# Patient Record
Sex: Female | Born: 1965 | Race: White | Hispanic: No | Smoking: Never smoker
Health system: Southern US, Community
[De-identification: ages and names within clinical notes are randomized; demographics above are authoritative.]

## PROBLEM LIST (undated history)

## (undated) DIAGNOSIS — E785 Hyperlipidemia, unspecified: Secondary | ICD-10-CM

## (undated) DIAGNOSIS — N809 Endometriosis, unspecified: Secondary | ICD-10-CM

## (undated) DIAGNOSIS — E049 Nontoxic goiter, unspecified: Secondary | ICD-10-CM

## (undated) DIAGNOSIS — I10 Essential (primary) hypertension: Secondary | ICD-10-CM

## (undated) HISTORY — DX: Hyperlipidemia, unspecified: E78.5

## (undated) HISTORY — DX: Nontoxic goiter, unspecified: E04.9

## (undated) HISTORY — PX: OOPHORECTOMY: SHX86

## (undated) HISTORY — DX: Endometriosis, unspecified: N80.9

---

## 2002-04-10 ENCOUNTER — Other Ambulatory Visit: Admission: RE | Admit: 2002-04-10 | Discharge: 2002-04-10 | Payer: Self-pay | Admitting: Obstetrics & Gynecology

## 2006-10-15 HISTORY — PX: ABDOMINAL HYSTERECTOMY: SHX81

## 2015-09-05 ENCOUNTER — Other Ambulatory Visit: Payer: Self-pay

## 2015-09-05 DIAGNOSIS — Z1231 Encounter for screening mammogram for malignant neoplasm of breast: Secondary | ICD-10-CM

## 2015-10-12 ENCOUNTER — Ambulatory Visit: Payer: Self-pay

## 2015-10-16 DIAGNOSIS — I10 Essential (primary) hypertension: Secondary | ICD-10-CM

## 2015-10-16 HISTORY — DX: Essential (primary) hypertension: I10

## 2016-04-05 DIAGNOSIS — R002 Palpitations: Secondary | ICD-10-CM | POA: Insufficient documentation

## 2016-08-20 DIAGNOSIS — F419 Anxiety disorder, unspecified: Secondary | ICD-10-CM | POA: Insufficient documentation

## 2017-01-25 ENCOUNTER — Emergency Department: Payer: BLUE CROSS/BLUE SHIELD

## 2017-01-25 ENCOUNTER — Emergency Department
Admission: EM | Admit: 2017-01-25 | Discharge: 2017-01-25 | Disposition: A | Discharge status: Home / Self Care | Payer: BLUE CROSS/BLUE SHIELD | Attending: Emergency Medicine | Admitting: Emergency Medicine

## 2017-01-25 ENCOUNTER — Encounter: Payer: Self-pay | Admitting: Emergency Medicine

## 2017-01-25 DIAGNOSIS — N838 Other noninflammatory disorders of ovary, fallopian tube and broad ligament: Secondary | ICD-10-CM

## 2017-01-25 DIAGNOSIS — Z79899 Other long term (current) drug therapy: Secondary | ICD-10-CM | POA: Insufficient documentation

## 2017-01-25 DIAGNOSIS — I10 Essential (primary) hypertension: Secondary | ICD-10-CM | POA: Insufficient documentation

## 2017-01-25 DIAGNOSIS — N839 Noninflammatory disorder of ovary, fallopian tube and broad ligament, unspecified: Secondary | ICD-10-CM | POA: Insufficient documentation

## 2017-01-25 DIAGNOSIS — R1031 Right lower quadrant pain: Secondary | ICD-10-CM | POA: Diagnosis present

## 2017-01-25 HISTORY — DX: Essential (primary) hypertension: I10

## 2017-01-25 LAB — CBC
HCT: 40 % (ref 35.0–47.0)
HEMOGLOBIN: 13.7 g/dL (ref 12.0–16.0)
MCH: 31.2 pg (ref 26.0–34.0)
MCHC: 34.2 g/dL (ref 32.0–36.0)
MCV: 91.1 fL (ref 80.0–100.0)
PLATELETS: 218 10*3/uL (ref 150–440)
RBC: 4.39 MIL/uL (ref 3.80–5.20)
RDW: 13 % (ref 11.5–14.5)
WBC: 4.4 10*3/uL (ref 3.6–11.0)

## 2017-01-25 LAB — URINALYSIS, COMPLETE (UACMP) WITH MICROSCOPIC
BILIRUBIN URINE: NEGATIVE
GLUCOSE, UA: NEGATIVE mg/dL
HGB URINE DIPSTICK: NEGATIVE
KETONES UR: NEGATIVE mg/dL
LEUKOCYTES UA: NEGATIVE
NITRITE: NEGATIVE
PH: 6 (ref 5.0–8.0)
Protein, ur: NEGATIVE mg/dL
Specific Gravity, Urine: 1.016 (ref 1.005–1.030)

## 2017-01-25 LAB — COMPREHENSIVE METABOLIC PANEL
ALBUMIN: 4.3 g/dL (ref 3.5–5.0)
ALK PHOS: 54 U/L (ref 38–126)
ALT: 19 U/L (ref 14–54)
AST: 21 U/L (ref 15–41)
Anion gap: 5 (ref 5–15)
BUN: 11 mg/dL (ref 6–20)
CALCIUM: 8.9 mg/dL (ref 8.9–10.3)
CO2: 26 mmol/L (ref 22–32)
CREATININE: 0.69 mg/dL (ref 0.44–1.00)
Chloride: 105 mmol/L (ref 101–111)
GFR calc Af Amer: >60 mL/min (ref >60)
GFR calc non Af Amer: >60 mL/min (ref >60)
GLUCOSE: 98 mg/dL (ref 65–99)
Potassium: 3.9 mmol/L (ref 3.5–5.1)
SODIUM: 136 mmol/L (ref 135–145)
Total Bilirubin: 0.7 mg/dL (ref 0.3–1.2)
Total Protein: 7.6 g/dL (ref 6.5–8.1)

## 2017-01-25 LAB — POCT PREGNANCY, URINE: Preg Test, Ur: NEGATIVE

## 2017-01-25 LAB — LIPASE, BLOOD: Lipase: 19 U/L (ref 11–51)

## 2017-01-25 MED ORDER — ONDANSETRON HCL 4 MG/2ML IJ SOLN
INTRAMUSCULAR | Status: AC
  Administered 2017-01-25: 4 mg via INTRAVENOUS
  Filled 2017-01-25: qty 2

## 2017-01-25 MED ORDER — FENTANYL CITRATE (PF) 100 MCG/2ML IJ SOLN
50.0000 ug | INTRAMUSCULAR | Status: DC | PRN
  Administered 2017-01-25: 50 ug via INTRAVENOUS

## 2017-01-25 MED ORDER — ONDANSETRON HCL 4 MG PO TABS: 4.0000 mg | ORAL_TABLET | Freq: Every day | ORAL | 1 refills | Status: DC | PRN

## 2017-01-25 MED ORDER — IOPAMIDOL (ISOVUE-300) INJECTION 61%
30.0000 mL | Freq: Once | INTRAVENOUS | Status: AC | PRN
  Administered 2017-01-25: 30 mL via ORAL

## 2017-01-25 MED ORDER — ONDANSETRON HCL 4 MG/2ML IJ SOLN
4.0000 mg | Freq: Once | INTRAMUSCULAR | Status: AC
  Administered 2017-01-25: 4 mg via INTRAVENOUS
  Filled 2017-01-25: qty 2

## 2017-01-25 MED ORDER — OXYCODONE-ACETAMINOPHEN 7.5-325 MG PO TABS: 1.0000 | ORAL_TABLET | ORAL | 0 refills | Status: DC | PRN

## 2017-01-25 MED ORDER — MORPHINE SULFATE (PF) 4 MG/ML IV SOLN
4.0000 mg | Freq: Once | INTRAVENOUS | Status: AC
  Administered 2017-01-25: 4 mg via INTRAVENOUS
  Filled 2017-01-25: qty 1

## 2017-01-25 MED ORDER — KETOROLAC TROMETHAMINE 30 MG/ML IJ SOLN
15.0000 mg | Freq: Once | INTRAMUSCULAR | Status: AC
  Administered 2017-01-25: 15 mg via INTRAVENOUS
  Filled 2017-01-25: qty 1

## 2017-01-25 MED ORDER — IOPAMIDOL (ISOVUE-300) INJECTION 61%
100.0000 mL | Freq: Once | INTRAVENOUS | Status: AC | PRN
  Administered 2017-01-25: 100 mL via INTRAVENOUS

## 2017-01-25 MED ORDER — ONDANSETRON HCL 4 MG/2ML IJ SOLN
4.0000 mg | Freq: Once | INTRAMUSCULAR | Status: AC
  Administered 2017-01-25: 4 mg via INTRAVENOUS

## 2017-01-25 MED ORDER — FENTANYL CITRATE (PF) 100 MCG/2ML IJ SOLN
INTRAMUSCULAR | Status: AC
  Filled 2017-01-25: qty 2

## 2017-01-25 NOTE — Discharge Instructions (Signed)
Please make an appointment to establish care with an OB gynecologist within 1 week for reexamination. Return to the emergency department immediately for any new or worsening symptoms such as worsening pain, if you cannot eat or drink, or for any other concerns whatsoever.  It was a pleasure to take care of you today, and thank you for coming to our emergency department.  If you have any questions or concerns before leaving please ask the nurse to grab me and I'm more than happy to go through your aftercare instructions again.  If you were prescribed any opioid pain medication today such as Norco, Vicodin, Percocet, morphine, hydrocodone, or oxycodone please make sure you do not drive when you are taking this medication as it can alter your ability to drive safely.  If you have any concerns once you are home that you are not improving or are in fact getting worse before you can make it to your follow-up appointment, please do not hesitate to call 911 and come back for further evaluation.  Tanya Brittle MD  Results for orders placed or performed during the hospital encounter of 01/25/17  Lipase, blood  Result Value Ref Range   Lipase 19 11 - 51 U/L  Comprehensive metabolic panel  Result Value Ref Range   Sodium 136 135 - 145 mmol/L   Potassium 3.9 3.5 - 5.1 mmol/L   Chloride 105 101 - 111 mmol/L   CO2 26 22 - 32 mmol/L   Glucose, Bld 98 65 - 99 mg/dL   BUN 11 6 - 20 mg/dL   Creatinine, Ser 6.57 0.44 - 1.00 mg/dL   Calcium 8.9 8.9 - 84.6 mg/dL   Total Protein 7.6 6.5 - 8.1 g/dL   Albumin 4.3 3.5 - 5.0 g/dL   AST 21 15 - 41 U/L   ALT 19 14 - 54 U/L   Alkaline Phosphatase 54 38 - 126 U/L   Total Bilirubin 0.7 0.3 - 1.2 mg/dL   GFR calc non Af Amer >60 >60 mL/min   GFR calc Af Amer >60 >60 mL/min   Anion gap 5 5 - 15  CBC  Result Value Ref Range   WBC 4.4 3.6 - 11.0 K/uL   RBC 4.39 3.80 - 5.20 MIL/uL   Hemoglobin 13.7 12.0 - 16.0 g/dL   HCT 96.2 95.2 - 84.1 %   MCV 91.1 80.0 - 100.0  fL   MCH 31.2 26.0 - 34.0 pg   MCHC 34.2 32.0 - 36.0 g/dL   RDW 32.4 40.1 - 02.7 %   Platelets 218 150 - 440 K/uL  Urinalysis, Complete w Microscopic  Result Value Ref Range   Color, Urine YELLOW (A) YELLOW   APPearance HAZY (A) CLEAR   Specific Gravity, Urine 1.016 1.005 - 1.030   pH 6.0 5.0 - 8.0   Glucose, UA NEGATIVE NEGATIVE mg/dL   Hgb urine dipstick NEGATIVE NEGATIVE   Bilirubin Urine NEGATIVE NEGATIVE   Ketones, ur NEGATIVE NEGATIVE mg/dL   Protein, ur NEGATIVE NEGATIVE mg/dL   Nitrite NEGATIVE NEGATIVE   Leukocytes, UA NEGATIVE NEGATIVE   RBC / HPF 0-5 0 - 5 RBC/hpf   WBC, UA 0-5 0 - 5 WBC/hpf   Bacteria, UA RARE (A) NONE SEEN   Squamous Epithelial / LPF 0-5 (A) NONE SEEN   Mucous PRESENT   Pregnancy, urine POC  Result Value Ref Range   Preg Test, Ur NEGATIVE NEGATIVE   Ct Abdomen Pelvis W Contrast  Result Date: 01/25/2017 CLINICAL DATA:  Right  side abdominal pain since 11 a.m. this morning. EXAM: CT ABDOMEN AND PELVIS WITH CONTRAST TECHNIQUE: Multidetector CT imaging of the abdomen and pelvis was performed using the standard protocol following bolus administration of intravenous contrast. CONTRAST:  100 ml ISOVUE-300 IOPAMIDOL (ISOVUE-300) INJECTION 61% COMPARISON:  None. FINDINGS: Lower chest: The lung bases are clear. No pleural or pericardial effusion. Hepatobiliary: No focal liver abnormality is seen. No gallstones, gallbladder wall thickening, or biliary dilatation. Pancreas: Unremarkable. No pancreatic ductal dilatation or surrounding inflammatory changes. Spleen: Normal in size without focal abnormality. Adrenals/Urinary Tract: Adrenal glands are unremarkable. Kidneys are normal, without renal calculi, focal lesion, or hydronephrosis. Bladder is unremarkable. Stomach/Bowel: Stomach is within normal limits. Appendix appears normal. No evidence of bowel wall thickening, distention, or inflammatory changes. Vascular/Lymphatic: Retroaortic left renal vein is noted. No  atherosclerosis or lymphadenopathy. Reproductive: Status post hysterectomy. There is a cystic lesion in the right lower quadrant of the abdomen measured 5.4 cm craniocaudal by 6.3 cm transverse by 4.8 cm AP with an enhancing mural component on its lateral side. This likely represents ovarian cyst. The left ovary is not visualized. Other: No lymphadenopathy or fluid collection. Musculoskeletal: A low attenuating lesion the right paraspinous musculature measures 4.1 cm craniocaudal by 2.4 cm transverse by 2.4 cm AP. The collection is centered off the posterior margin of the right L2-3 facet. No lytic or sclerotic bony lesion is identified. IMPRESSION: Cystic lesion in the right lower quadrant of the abdomen has an appearance worrisome for ovarian neoplasm. MRI of the pelvis with and without contrast is recommended for further evaluation. Low attenuating lesion in the right paraspinous musculature does not appear aggressive and may be a benign entity such as a ganglion or myxoma. The lesion cannot be definitively characterized. Nonemergent MRI of the lumbar spine with and without contrast is recommended for further evaluation. Negative for appendicitis. Electronically Signed   By: Drusilla Kanner M.D.   On: 01/25/2017 16:58

## 2017-01-25 NOTE — ED Provider Notes (Signed)
[facility] Emergency Department Provider Note  ____________________________________________   First MD Initiated Contact with Patient 01/25/17 1526     (approximate)  I have reviewed the triage vital signs and the nursing notes.   HISTORY  Chief Complaint Abdominal Pain    HPI Tanya Hill is a 51 y.o. female who comes to the emergency department with 1 day of severe right lower quadrant pain. Pain began around 11 AM and has been relatively constant since. Pain is sharp aching stabbing and nonradiating. She's not had an appetite and she has been somewhat nauseated but has not vomited. She has a past surgical history of a hysterectomy and a left oophorectomy as well as several cesarean sections. She has been passing flatus. She's had no fevers or chills. She has no dysuria frequency or hesitancy. She denies back pain. Nothing makes the pain better or worse.   Past Medical History:  Diagnosis Date  . Hypertension     There are no active problems to display for this patient.   Past Surgical History:  Procedure Laterality Date  . ABDOMINAL HYSTERECTOMY  1995    Prior to Admission medications   Medication Sig Start Date End Date Taking? Authorizing Provider  loratadine (CLARITIN) 10 MG tablet Take 10 mg by mouth daily.   Yes Historical Provider, MD  metoprolol succinate (TOPROL-XL) 25 MG 24 hr tablet Take 25 mg by mouth daily. 11/28/16  Yes Historical Provider, MD  fluticasone (FLONASE) 50 MCG/ACT nasal spray Place 1 spray into both nostrils daily.    Historical Provider, MD  ondansetron (ZOFRAN) 4 MG tablet Take 1 tablet (4 mg total) by mouth daily as needed for nausea or vomiting. 01/25/17 01/25/18  Merrily Brittle, MD  oxyCODONE-acetaminophen (PERCOCET) 7.5-325 MG tablet Take 1 tablet by mouth every 4 (four) hours as needed for severe pain. 01/25/17   Merrily Brittle, MD    Allergies Latex and Penicillins  History reviewed. No pertinent family  history.  Social History Social History  Substance Use Topics  . Smoking status: Never Smoker  . Smokeless tobacco: Never Used  . Alcohol use Not on file    Review of Systems Constitutional: No fever/chills Eyes: No visual changes. ENT: No sore throat. Cardiovascular: Denies chest pain. Respiratory: Denies shortness of breath. Gastrointestinal: +abdominal pain.  +nausea, no vomiting.  No diarrhea.  No constipation. Genitourinary: Negative for dysuria. Musculoskeletal: Negative for back pain. Skin: Negative for rash. Neurological: Negative for headaches, focal weakness or numbness.  10-point ROS otherwise negative.  ____________________________________________   PHYSICAL EXAM:  VITAL SIGNS: ED Triage Vitals  Enc Vitals Group     BP 01/25/17 1349 119/66     Pulse Rate 01/25/17 1349 70     Resp 01/25/17 1349 20     Temp 01/25/17 1349 99.5 F (37.5 C)     Temp Source 01/25/17 1349 Oral     SpO2 01/25/17 1349 100 %     Weight 01/25/17 1349 154 lb (69.9 kg)     Height 01/25/17 1349  (1.626 m)     Head Circumference --      Peak Flow --      Pain Score 01/25/17 1354 10     Pain Loc --      Pain Edu? --      Excl. in GC? --     Constitutional: Alert and oriented x 4 Appears uncomfortable but nontoxic no diaphoresis speaks in full, clear sentences Eyes: PERRL EOMI. Head: Atraumatic. Nose:  No congestion/rhinnorhea. Mouth/Throat: No trismus Neck: No stridor.   Cardiovascular: Normal rate, regular rhythm. Grossly normal heart sounds.  Good peripheral circulation. Respiratory: Normal respiratory effort.  No retractions. Lungs CTAB and moving good air Gastrointestinal: Soft nondistended quite tender in right lower quadrant with rebound and guarding but negative Rovsing's and no frank peritonitis Musculoskeletal: No lower extremity edema   Neurologic:  Normal speech and language. No gross focal neurologic deficits are appreciated. Skin:  Skin is warm, dry and intact.  No rash noted. Psychiatric: Mood and affect are normal. Speech and behavior are normal.    ____________________________________________   DIFFERENTIAL appendicitis, ovarian torsion, diverticulitis, cholecystitis   LABS (all labs ordered are listed, but only abnormal results are displayed)  Labs Reviewed  URINALYSIS, COMPLETE (UACMP) WITH MICROSCOPIC - Abnormal; Notable for the following:       Result Value   Color, Urine YELLOW (*)    APPearance HAZY (*)    Bacteria, UA RARE (*)    Squamous Epithelial / LPF 0-5 (*)    All other components within normal limits  LIPASE, BLOOD  COMPREHENSIVE METABOLIC PANEL  CBC  POC URINE PREG, ED  POCT PREGNANCY, URINE    No signs of infection labs unremarkable __________________________________________  EKG   ____________________________________________  RADIOLOGY  CT abdomen and pelvis concerning for right ovarian malignancy ____________________________________________   PROCEDURES  Procedure(s) performed: no  Procedures  Critical Care performed: no  ____________________________________________   INITIAL IMPRESSION / ASSESSMENT AND PLAN / ED COURSE  Pertinent labs & imaging results that were available during my care of the patient were reviewed by me and considered in my medical decision making (see chart for details).  The patient has significant right lower quadrant tenderness with rebound and guarding. Despite normal blood work at this point she requires a CT scan.     ----------------------------------------- 5:33 PM on 01/25/2017 -----------------------------------------  Unfortunately the patient's CT scan is concerning for ovarian malignancy. I disclose this information to the patient and her daughter at bedside and asked the patient if she had an OB gynecologist she could follow up with and she said she did not. She is asking for referral. At this point her pain is controlled with morphine so we'll discharge  her with a course of Percocet and Zofran for nausea and strict return precautions. The patient seems relatively optimistic and is medically stable for outpatient management. ____________________________________________   FINAL CLINICAL IMPRESSION(S) / ED DIAGNOSES  Final diagnoses:  Ovarian mass, right      NEW MEDICATIONS STARTED DURING THIS VISIT:  Current Discharge Medication List    START taking these medications   Details  ondansetron (ZOFRAN) 4 MG tablet Take 1 tablet (4 mg total) by mouth daily as needed for nausea or vomiting. Qty: 30 tablet, Refills: 1    oxyCODONE-acetaminophen (PERCOCET) 7.5-325 MG tablet Take 1 tablet by mouth every 4 (four) hours as needed for severe pain. Qty: 29 tablet, Refills: 0         Note:  This document was prepared using Dragon voice recognition software and may include unintentional dictation errors.     Merrily Brittle, MD 01/25/17 1736

## 2017-01-25 NOTE — ED Triage Notes (Signed)
[facility] from [facility] with c/o abdominal pain. Patient describes pin point pain in the RLQ with radiation to RUQ

## 2017-01-25 NOTE — ED Notes (Signed)
RN called CT to inform that pt had completed contrast.

## 2017-01-28 ENCOUNTER — Ambulatory Visit (INDEPENDENT_AMBULATORY_CARE_PROVIDER_SITE_OTHER): Payer: BLUE CROSS/BLUE SHIELD | Admitting: Obstetrics and Gynecology

## 2017-01-28 ENCOUNTER — Ambulatory Visit (INDEPENDENT_AMBULATORY_CARE_PROVIDER_SITE_OTHER): Payer: BLUE CROSS/BLUE SHIELD

## 2017-01-28 ENCOUNTER — Encounter: Payer: Self-pay | Admitting: Obstetrics and Gynecology

## 2017-01-28 ENCOUNTER — Other Ambulatory Visit: Payer: BLUE CROSS/BLUE SHIELD

## 2017-01-28 VITALS — BP 120/65 | HR 63 | Ht 64.0 in | Wt 156.0 lb

## 2017-01-28 DIAGNOSIS — N83201 Unspecified ovarian cyst, right side: Secondary | ICD-10-CM

## 2017-01-28 DIAGNOSIS — R102 Pelvic and perineal pain: Secondary | ICD-10-CM

## 2017-01-28 NOTE — Progress Notes (Signed)
HPI:      [name] is a 51 y.o. [phone] who LMP was No LMP recorded. Patient has had a hysterectomy.  Subjective:   She presents today After being seen in the emergency department for pelvic pain. A CT revealed a cyst of her right ovary "suspicious for neoplasm".  The patient has a dull pelvic pain at this time but her acute pain has resolved. She reports no other signs and symptoms of ovarian cancer. No family history of ovarian breast or colon cancer. Patient has a history of endometriosis-she underwent hysterectomy and left oophorectomy in the past.    Hx: The following portions of the patient's history were reviewed and updated as appropriate:              She  has a past medical history of Endometriosis and Hypertension. She  does not have a problem list on file. She  has a past surgical history that includes Abdominal hysterectomy (1995) and Oophorectomy. Her family history includes Cancer in her father; Hyperlipidemia in her father; Hypertension in her mother; Stroke in her mother. She  reports that she has never smoked. She has never used smokeless tobacco. She reports that she does not drink alcohol or use drugs. Current Outpatient Prescriptions on File Prior to Visit  Medication Sig Dispense Refill  . fluticasone (FLONASE) 50 MCG/ACT nasal spray Place 1 spray into both nostrils daily.    Marland Kitchen loratadine (CLARITIN) 10 MG tablet Take 10 mg by mouth daily.    . metoprolol succinate (TOPROL-XL) 25 MG 24 hr tablet Take 25 mg by mouth daily.  5  . oxyCODONE-acetaminophen (PERCOCET) 7.5-325 MG tablet Take 1 tablet by mouth every 4 (four) hours as needed for severe pain. 29 tablet 0   No current facility-administered medications on file prior to visit.          Review of Systems:  Review of Systems  Constitutional: Denied constitutional symptoms, night sweats, recent illness, fatigue, fever, insomnia and weight loss.  Eyes: Denied eye symptoms, eye pain, photophobia, vision  change and visual disturbance.  Ears/Nose/Throat/Neck: Denied ear, nose, throat or neck symptoms, hearing loss, nasal discharge, sinus congestion and sore throat.  Cardiovascular: Denied cardiovascular symptoms, arrhythmia, chest pain/pressure, edema, exercise intolerance, orthopnea and palpitations.  Respiratory: Denied pulmonary symptoms, asthma, pleuritic pain, productive sputum, cough, dyspnea and wheezing.  Gastrointestinal: Denied, gastro-esophageal reflux, melena, nausea and vomiting.  Genitourinary: See HPI for additional information.  Musculoskeletal: Denied musculoskeletal symptoms, stiffness, swelling, muscle weakness and myalgia.  Dermatologic: Denied dermatology symptoms, rash and scar.  Neurologic: Denied neurology symptoms, dizziness, headache, neck pain and syncope.  Psychiatric: Denied psychiatric symptoms, anxiety and depression.  Endocrine: Denied endocrine symptoms including hot flashes and night sweats.   Meds:   Current Outpatient Prescriptions on File Prior to Visit  Medication Sig Dispense Refill  . fluticasone (FLONASE) 50 MCG/ACT nasal spray Place 1 spray into both nostrils daily.    Marland Kitchen loratadine (CLARITIN) 10 MG tablet Take 10 mg by mouth daily.    . metoprolol succinate (TOPROL-XL) 25 MG 24 hr tablet Take 25 mg by mouth daily.  5  . oxyCODONE-acetaminophen (PERCOCET) 7.5-325 MG tablet Take 1 tablet by mouth every 4 (four) hours as needed for severe pain. 29 tablet 0   No current facility-administered medications on file prior to visit.     Objective:     Vitals:   01/28/17 1054  BP: 120/65  Pulse: 63  CAT scan results reviewed directly with the patient.  Assessment:    G2P2002 There are no active problems to display for this patient.    1. Cyst of right ovary   2. Pelvic pain in female        Plan:            1.  Transvaginal ultrasound to characterize the cyst and performed Doppler blood flow.  2.  Ovarian cancer screening  blood work  3.  Consider MRI or GYN oncology consult as needed.  Orders Orders Placed This Encounter  Procedures  . US Transvaginal Non-OB  . CA 125  . Alpha fetoprotein, maternal  . CEA  . B-HCG Quant    No orders of the defined types were placed in this encounter.       F/U  Return in about 1 week (around 02/04/2017) for We will contact her with any abnormal test results. I spent 32 minutes with this patient of which greater than 50% was spent discussing ovarian cancer, ovarian cysts, endometriosis, multiple workup possibilities, surgeries for ovarian cyst versus ovarian cancer. All patient's questions were answered during this time.  Elonda Husky, M.D. 01/28/2017 12:15 PM

## 2017-01-29 ENCOUNTER — Other Ambulatory Visit: Payer: Self-pay | Admitting: Obstetrics and Gynecology

## 2017-01-29 ENCOUNTER — Ambulatory Visit (INDEPENDENT_AMBULATORY_CARE_PROVIDER_SITE_OTHER): Payer: BLUE CROSS/BLUE SHIELD | Admitting: Obstetrics and Gynecology

## 2017-01-29 ENCOUNTER — Other Ambulatory Visit: Payer: BLUE CROSS/BLUE SHIELD

## 2017-01-29 VITALS — BP 143/72

## 2017-01-29 DIAGNOSIS — N83201 Unspecified ovarian cyst, right side: Secondary | ICD-10-CM

## 2017-01-29 LAB — CEA: CEA: 1.2 ng/mL (ref 0.0–4.7)

## 2017-01-29 LAB — CA 125: CA 125: 40.2 U/mL — ABNORMAL HIGH (ref 0.0–38.1)

## 2017-01-29 NOTE — Progress Notes (Signed)
HPI:      Tanya Hill is a 51 y.o. [phone] who LMP was No LMP recorded. Patient has had a hysterectomy.  Subjective:   She presents today To discuss the results we have so far in to get her blood drawn for an He4.  (Use in [facility])   Hx: The following portions of the patient's history were reviewed and updated as appropriate:              She  has a past medical history of Endometriosis and Hypertension. She  does not have a problem list on file. She  has a past surgical history that includes Abdominal hysterectomy (1995) and Oophorectomy. Current Outpatient Prescriptions on File Prior to Visit  Medication Sig Dispense Refill  . fluticasone (FLONASE) 50 MCG/ACT nasal spray Place 1 spray into both nostrils daily.    Marland Kitchen loratadine (CLARITIN) 10 MG tablet Take 10 mg by mouth daily.    . metoprolol succinate (TOPROL-XL) 25 MG 24 hr tablet Take 25 mg by mouth daily.  5  . oxyCODONE-acetaminophen (PERCOCET) 7.5-325 MG tablet Take 1 tablet by mouth every 4 (four) hours as needed for severe pain. 29 tablet 0   No current facility-administered medications on file prior to visit.          Review of Systems:  Review of Systems  Constitutional: Denied constitutional symptoms, night sweats, recent illness, fatigue, fever, insomnia and weight loss.  Eyes: Denied eye symptoms, eye pain, photophobia, vision change and visual disturbance.  Ears/Nose/Throat/Neck: Denied ear, nose, throat or neck symptoms, hearing loss, nasal discharge, sinus congestion and sore throat.  Cardiovascular: Denied cardiovascular symptoms, arrhythmia, chest pain/pressure, edema, exercise intolerance, orthopnea and palpitations.  Respiratory: Denied pulmonary symptoms, asthma, pleuritic pain, productive sputum, cough, dyspnea and wheezing.  Gastrointestinal: Denied, gastro-esophageal reflux, melena, nausea and vomiting.  Genitourinary: See HPI for additional information.  Musculoskeletal: Denied musculoskeletal symptoms,  stiffness, swelling, muscle weakness and myalgia.  Dermatologic: Denied dermatology symptoms, rash and scar.  Neurologic: Denied neurology symptoms, dizziness, headache, neck pain and syncope.  Psychiatric: Denied psychiatric symptoms, anxiety and depression.  Endocrine: Denied endocrine symptoms including hot flashes and night sweats.   Meds:   Current Outpatient Prescriptions on File Prior to Visit  Medication Sig Dispense Refill  . fluticasone (FLONASE) 50 MCG/ACT nasal spray Place 1 spray into both nostrils daily.    Marland Kitchen loratadine (CLARITIN) 10 MG tablet Take 10 mg by mouth daily.    . metoprolol succinate (TOPROL-XL) 25 MG 24 hr tablet Take 25 mg by mouth daily.  5  . oxyCODONE-acetaminophen (PERCOCET) 7.5-325 MG tablet Take 1 tablet by mouth every 4 (four) hours as needed for severe pain. 29 tablet 0   No current facility-administered medications on file prior to visit.     Objective:     Vitals:   01/29/17 1401  BP: (!) 143/72                Assessment:    G2P2002 There are no active problems to display for this patient.    1. Cyst of right ovary     Lab says elevation of CA-125 however in speaking with GYN oncology this CA-125 level is considered "normal for a 51 year old woman."   Plan:            1.  Dr. Joice Lofts Recommends performing He4 to go with the CA-125 for the [facility] test algorithm.  Based on this result we can consider surgical intervention versus expectant management  versus formal GYN oncology consultation.  I have discussed all the above in detail with the patient.   F/U  Return in about 1 week (around 02/05/2017) for We will contact her with any abnormal test results. I spent 14 minutes with this patient of which greater than 50% was spent discussing ovarian cancer, her ongoing test results, future tests and use of the algorithm above.   Elonda Husky, M.D. 01/29/2017 2:12 PM

## 2017-01-31 LAB — OVARIAN MALIGNANCY RISK-ROMA
CA 125: 42.3 U/mL — AB (ref 0.0–38.1)
HE4: 40 pmol/L (ref 0.0–63.6)
Postmenopausal ROMA: 1.81
Premenopausal ROMA: 0.48

## 2017-01-31 LAB — POSTMENOPAUSAL INTERP: LOW

## 2017-01-31 LAB — PREMENOPAUSAL INTERP: LOW

## 2017-02-05 ENCOUNTER — Encounter: Payer: Self-pay | Admitting: Obstetrics and Gynecology

## 2017-02-05 ENCOUNTER — Ambulatory Visit (INDEPENDENT_AMBULATORY_CARE_PROVIDER_SITE_OTHER): Payer: BLUE CROSS/BLUE SHIELD | Admitting: Obstetrics and Gynecology

## 2017-02-05 VITALS — BP 115/69 | HR 75 | Ht 65.0 in | Wt 155.3 lb

## 2017-02-05 DIAGNOSIS — N83201 Unspecified ovarian cyst, right side: Secondary | ICD-10-CM

## 2017-02-05 DIAGNOSIS — R102 Pelvic and perineal pain: Secondary | ICD-10-CM | POA: Diagnosis not present

## 2017-02-05 NOTE — H&P (Signed)
PRE-OPERATIVE HISTORY AND PHYSICAL EXAM  PCP:  Feliciana Rossetti, MD Subjective:   HPI:  Tanya Hill is a 51 y.o. [id number].  No LMP recorded. Patient has had a hysterectomy.  She presents today for a pre-op discussion and PE.  She has the following symptoms:  Pelvic pain-ovarian cyst.  Review of Systems:   Constitutional: Denied constitutional symptoms, night sweats, recent illness, fatigue, fever, insomnia and weight loss.  Eyes: Denied eye symptoms, eye pain, photophobia, vision change and visual disturbance.  Ears/Nose/Throat/Neck: Denied ear, nose, throat or neck symptoms, hearing loss, nasal discharge, sinus congestion and sore throat.  Cardiovascular: Denied cardiovascular symptoms, arrhythmia, chest pain/pressure, edema, exercise intolerance, orthopnea and palpitations.  Respiratory: Denied pulmonary symptoms, asthma, pleuritic pain, productive sputum, cough, dyspnea and wheezing.  Gastrointestinal: Denied, gastro-esophageal reflux, melena, nausea and vomiting.  Genitourinary: Denied genitourinary symptoms including symptomatic vaginal discharge, pelvic relaxation issues, and urinary complaints.  Musculoskeletal: Denied musculoskeletal symptoms, stiffness, swelling, muscle weakness and myalgia.  Dermatologic: Denied dermatology symptoms, rash and scar.  Neurologic: Denied neurology symptoms, dizziness, headache, neck pain and syncope.  Psychiatric: Denied psychiatric symptoms, anxiety and depression.  Endocrine: Denied endocrine symptoms including hot flashes and night sweats.   OB History  Gravida Para Term Preterm AB Living  SAB TAB Ectopic Multiple Live Births          2    # Outcome Date GA Lbr Len/2nd Weight Sex Delivery Anes PTL Lv  2 Term 1992   9 lb 8 oz (4.309 kg) F Vag-Spont  N LIV  1 Term 1990   9 lb 1 oz (4.111 kg) F CS-Unspec  N LIV      Past Medical History:  Diagnosis Date  . Endometriosis   . Hypertension     Past Surgical History:    Procedure Laterality Date  . ABDOMINAL HYSTERECTOMY  1995  . OOPHORECTOMY        SOCIAL HISTORY: History  Smoking Status  . Never Smoker  Smokeless Tobacco  . Never Used   History  Alcohol Use No   History  Drug Use No    Family History  Problem Relation Age of Onset  . Hypertension Mother   . Stroke Mother   . Cancer Father   . Hyperlipidemia Father     ALLERGIES:  Latex and Penicillins  MEDS:   Current Outpatient Prescriptions on File Prior to Visit  Medication Sig Dispense Refill  . fluticasone (FLONASE) 50 MCG/ACT nasal spray Place 1 spray into both nostrils daily.    Marland Kitchen loratadine (CLARITIN) 10 MG tablet Take 10 mg by mouth daily.    . metoprolol succinate (TOPROL-XL) 25 MG 24 hr tablet Take 25 mg by mouth daily.  5   No current facility-administered medications on file prior to visit.     No orders of the defined types were placed in this encounter.    Physical examination BP 115/69   Pulse 75   Ht  (1.651 m)   Wt 155 lb 5 oz (70.4 kg)   BMI 25.85 kg/m   General NAD, Conversant  HEENT Atraumatic; Op clear with mmm.  Normo-cephalic. Pupils reactive. Anicteric sclerae  Thyroid/Neck Smooth without nodularity or enlargement. Normal ROM.  Neck Supple.  Skin No rashes, lesions or ulceration. Normal palpated skin turgor. No nodularity.  Breasts: No masses or discharge.  Symmetric.  No axillary adenopathy.  Lungs: Clear to auscultation.No rales or wheezes.  Normal Respiratory effort, no retractions.  Heart: NSR.  No murmurs or rubs appreciated. No periferal edema  Abdomen: Soft.  Non-tender.  No masses.  No HSM. No hernia  Extremities: Moves all appropriately.  Normal ROM for age. No lymphadenopathy.  Neuro: Oriented to PPT.  Normal mood. Normal affect.     Pelvic:   Vulva: Normal appearance.  No lesions.  Vagina: No lesions or abnormalities noted.  Support: Normal pelvic support.  Urethra No masses tenderness or scarring.  Meatus Normal size  without lesions or prolapse.  Cervix: Surgically absent   Anus: Normal exam.  No lesions.  Perineum: Normal exam.  No lesions.        Bimanual   Uterus: Surgically absent   Adnexae: No masses palpated.  Some right-sided pelvic pain.   Cul-de-sac: Negative for abnormality.   Ultrasound reveals a cystic pelvic mass consistent with ovarian cyst.  Assessment:   G2P2002   1. Cyst of right ovary   2. Pelvic pain in female     Low risk of malignancy based on appearance and ROMA testing.  This was done in consultation with Dr. Baird Kay.  Plan:   1.  Laparoscopic oophorectomy to include fallopian tube if present.  possible laparotomy should extensive pelvic disease or adhesions be noted.    Elonda Husky, M.D. 02/05/2017 4:21 PM

## 2017-02-05 NOTE — Progress Notes (Addendum)
HPI:      [name] is a 51 y.o. [phone] who LMP was No LMP recorded. Patient has had a hysterectomy.  Subjective:   She presents today For follow-up of ovarian cyst and pelvic pain. She had the ROMA testing done as advised by Dr. Baird Kay.    Hx: The following portions of the patient's history were reviewed and updated as appropriate:              She  has a past medical history of Endometriosis and Hypertension. She  does not have a problem list on file. She  has a past surgical history that includes Abdominal hysterectomy (1995) and Oophorectomy. Her family history includes Cancer in her father; Hyperlipidemia in her father; Hypertension in her mother; Stroke in her mother. She has a current medication list which includes the following prescription(s): fluticasone, loratadine, and metoprolol succinate. Current Outpatient Prescriptions on File Prior to Visit  Medication Sig Dispense Refill  . fluticasone (FLONASE) 50 MCG/ACT nasal spray Place 1 spray into both nostrils daily.    Marland Kitchen loratadine (CLARITIN) 10 MG tablet Take 10 mg by mouth daily.    . metoprolol succinate (TOPROL-XL) 25 MG 24 hr tablet Take 25 mg by mouth daily.  5   No current facility-administered medications on file prior to visit.          Review of Systems:  Review of Systems  Constitutional: Denied constitutional symptoms, night sweats, recent illness, fatigue, fever, insomnia and weight loss.  Eyes: Denied eye symptoms, eye pain, photophobia, vision change and visual disturbance.  Ears/Nose/Throat/Neck: Denied ear, nose, throat or neck symptoms, hearing loss, nasal discharge, sinus congestion and sore throat.  Cardiovascular: Denied cardiovascular symptoms, arrhythmia, chest pain/pressure, edema, exercise intolerance, orthopnea and palpitations.  Respiratory: Denied pulmonary symptoms, asthma, pleuritic pain, productive sputum, cough, dyspnea and wheezing.  Gastrointestinal: Denied, gastro-esophageal  reflux, melena, nausea and vomiting.  Genitourinary: See HPI for additional information.  Musculoskeletal: Denied musculoskeletal symptoms, stiffness, swelling, muscle weakness and myalgia.  Dermatologic: Denied dermatology symptoms, rash and scar.  Neurologic: Denied neurology symptoms, dizziness, headache, neck pain and syncope.  Psychiatric: Denied psychiatric symptoms, anxiety and depression.  Endocrine: Denied endocrine symptoms including hot flashes and night sweats.   Meds:   Current Outpatient Prescriptions on File Prior to Visit  Medication Sig Dispense Refill  . fluticasone (FLONASE) 50 MCG/ACT nasal spray Place 1 spray into both nostrils daily.    Marland Kitchen loratadine (CLARITIN) 10 MG tablet Take 10 mg by mouth daily.    . metoprolol succinate (TOPROL-XL) 25 MG 24 hr tablet Take 25 mg by mouth daily.  5   No current facility-administered medications on file prior to visit.     Objective:     Vitals:   02/05/17 1331  BP: 115/69  Pulse: 75              See H&P for exam  Assessment:    G2P2002 There are no active problems to display for this patient.    1. Cyst of right ovary   2. Pelvic pain in female     ROMA in consultation with Dr. Baird Kay is less than a 4% chance risk of malignancy. She has advised laparoscopic approach if possible.   Plan:            1.  I have discussed the laparoscopic approach versus open with the patient. Risks and benefits of each were discussed. The small risk of malignancy was also discussed. We specifically  discussed her increased risk at surgery the cause of her previous history of endometriosis and possible pelvic adhesions, because of her increased risk from prior surgeries and because of the presence of the ovarian cyst. She is aware that should this be a malignancy additional surgery would be required. We specifically discussed risks to bowel bladder or other internal organs because of her previous surgery and endometriosis  history.  Exp Lyp Mass We have discussed the procedure of Exploratory Laparoscopy in detail.  She has elected to have a Laparoscopy performed to attempt to diagnose and manage her pain and mass.  She has been informed that Oophorectomy will result in surgical menopause for her and that ERT may be appropriate.  I have informed her that Laparoscopy, like other surgical procedures, entails the following risks:  bleeding, infection, damage to bowel, bladder or other internal organ, and the risk or anesthesia.  She is aware that her risks are not limited to these.  We have discussed the possibility of Endometriosis and Pelvic Adhesive Disease.  We have discussed surgical as well as subsequent medical management of these conditions.  The patient is aware that both of these conditions can often be diagnosed and treated via Laparoscopy.   I have answered all of her questions and I believe she has been well informed regarding the risks/benefits of Exploratory Laparoscopy for pelvic pain and pelvic mass.  Orders No orders of the defined types were placed in this encounter.   No orders of the defined types were placed in this encounter.       F/U  Return for As Scheduled Post-op.  Elonda Husky, M.D. 02/05/2017 4:14 PM

## 2017-02-12 ENCOUNTER — Telehealth: Payer: Self-pay | Admitting: Obstetrics and Gynecology

## 2017-02-12 NOTE — Telephone Encounter (Signed)
Per [facility] records given to provider to review. Pt informed.

## 2017-02-12 NOTE — Telephone Encounter (Signed)
Patient wants to speak with you regarding her records that we received for her. You can reach her at [phone].

## 2017-02-22 ENCOUNTER — Other Ambulatory Visit: Payer: BLUE CROSS/BLUE SHIELD

## 2017-02-22 ENCOUNTER — Ambulatory Visit
Admission: RE | Admit: 2017-02-22 | Discharge: 2017-02-22 | Disposition: A | Discharge status: Home / Self Care | Payer: BLUE CROSS/BLUE SHIELD | Source: Ambulatory Visit | Attending: Obstetrics and Gynecology | Admitting: Obstetrics and Gynecology

## 2017-02-22 DIAGNOSIS — Z01812 Encounter for preprocedural laboratory examination: Secondary | ICD-10-CM | POA: Diagnosis not present

## 2017-02-22 LAB — CBC
HEMATOCRIT: 38.3 % (ref 35.0–47.0)
HEMOGLOBIN: 13.2 g/dL (ref 12.0–16.0)
MCH: 31.4 pg (ref 26.0–34.0)
MCHC: 34.4 g/dL (ref 32.0–36.0)
MCV: 91.3 fL (ref 80.0–100.0)
Platelets: 219 10*3/uL (ref 150–440)
RBC: 4.19 MIL/uL (ref 3.80–5.20)
RDW: 13.1 % (ref 11.5–14.5)
WBC: 4.5 10*3/uL (ref 3.6–11.0)

## 2017-02-22 LAB — TYPE AND SCREEN
ABO/RH(D): O POS
ANTIBODY SCREEN: NEGATIVE

## 2017-02-22 NOTE — Patient Instructions (Signed)
Your procedure is scheduled on: Monday 02-25-2017 AT 06:00 AM Su procedimiento est programado para: Report to [facility] 2ND FLOOR COME THRU THE REVOLVING DOOR ENTRANCE Presntese a:  Remember: Instructions that are not followed completely may result in serious medical risk, up to and including death, or upon the discretion of your surgeon and anesthesiologist your surgery may need to be rescheduled.  Recuerde: Las instrucciones que no se siguen completamente Armed forces logistics/support/administrative officer en un riesgo de salud grave, incluyendo hasta la [place] o a discrecin de su cirujano y Scientific laboratory technician, su ciruga se puede posponer.   _X___ 1. Do not eat food or drink liquids after midnight. No gum chewing or hard candies.  No coma alimentos ni tome lquidos despus de la medianoche.  No mastique chicle ni caramelos  duros.     __X__ 2. No alcohol for 24 hours before or after surgery AND NO SMOKING 24 HRS BEFORE AND AFTER SURGERY    No tome alcohol durante las 24 horas antes ni despus de la [place].   __X__ 3. Bring all medications with you on the day of surgery if instructed. BRING ANY NEW MEDICATIONS    Lleve todos los medicamentos con usted el da de su ciruga si se le ha indicado as.   __X__ 4. Notify your doctor if there is any change in your medical condition (cold, fever,                             infections).    Informe a su mdico si hay algn cambio en su condicin mdica (resfriado, fiebre, infecciones).   Do not wear jewelry, make-up, hairpins, clips or nail polish.  No use joyas, maquillajes, pinzas/ganchos para el cabello ni esmalte de uas.  Do not wear lotions, powders, or perfumes. You may NOT wear deodorant.  No use lociones, polvos o perfumes.  Puede usar desodorante.    Do not shave 48 hours prior to surgery. Men may shave face and neck.  No se afeite 48 horas antes de la [place].  Los hombres pueden [employer] cara y el cuello.   Do not bring valuables to the hospital.   No lleve  objetos de valor al hospital.  [facility] is not responsible for any belongings or valuables.  [place] no se hace responsable de ningn tipo de pertenencias u objetos de Licensed conveyancer.               Contacts, dentures or bridgework may not be worn into surgery.  Los lentes de [place], las dentaduras postizas o puentes no se pueden usar en la [place].  Leave your suitcase in the car. After surgery it may be brought to your room.  Deje su maleta en el auto.  Despus de la ciruga podr traerla a su habitacin.  For patients admitted to the hospital, discharge time is determined by your treatment team.  Para los pacientes que sean ingresados al hospital, el tiempo en el cual se le dar de alta es determinado por su                equipo de [place].   Patients discharged the day of surgery will not be allowed to drive home. A los pacientes que se les da de alta el mismo da de la ciruga no se les permitir conducir a Higher education careers adviser.   Please read over the following fact sheets that you were given: Por favor lea las siguientes hojas de  informacin que le dieron:     __X__ Take these medicines the morning of surgery with A SIP OF WATER:          Tome estas medicinas la maana de la ciruga con UN SORBO DE AGUA:  1.    2.  3.   4.       5.  6.  ____ Fleet Enema (as directed)          Enema de Fleet (segn lo indicado)    __X__ Use CHG Soap as directed          Utilice el jabn de CHG segn lo indicado  ____ Use inhalers on the day of surgery          Use los inhaladores el da de la ciruga  ____ Stop metformin 2 days prior to surgery          Deje de tomar el metformin 2 das antes de la ciruga    ____ Take 1/2 of usual insulin dose the night before surgery and none on the morning of surgery           Tome la mitad de la dosis habitual de insulina la noche antes de la [place] y no tome nada en la maana de la             ciruga  ____ Stop Coumadin/Plavix/aspirin on           Deje de  tomar el Coumadin/Plavix/aspirina el da:  __X__ Stop Anti-inflammatories UNTIL AFTER SURGERY ADVIL, MOTRIN, IBUPROFEN, ALEVE, ANAPROX,GOODY'S POWDER - USE ONLY TYLENOL          Deje de tomar antiinflamatorios el da:   ____ Stop supplements until after surgery            Deje de tomar suplementos hasta despus de la ciruga  ____ Bring C-Pap to the hospital          Lleve el C-Pap al hospital

## 2017-02-25 ENCOUNTER — Encounter: Payer: Self-pay | Admitting: *Deleted

## 2017-02-25 ENCOUNTER — Encounter
Admission: RE | Disposition: A | Discharge status: Home / Self Care | Payer: Self-pay | Source: Ambulatory Visit | Attending: Obstetrics and Gynecology

## 2017-02-25 ENCOUNTER — Ambulatory Visit: Payer: BLUE CROSS/BLUE SHIELD | Admitting: Anesthesiology

## 2017-02-25 ENCOUNTER — Ambulatory Visit
Admission: RE | Admit: 2017-02-25 | Discharge: 2017-02-25 | Disposition: A | Discharge status: Home / Self Care | Payer: BLUE CROSS/BLUE SHIELD | Source: Ambulatory Visit | Attending: Obstetrics and Gynecology | Admitting: Obstetrics and Gynecology

## 2017-02-25 DIAGNOSIS — N83201 Unspecified ovarian cyst, right side: Secondary | ICD-10-CM | POA: Insufficient documentation

## 2017-02-25 DIAGNOSIS — Z79899 Other long term (current) drug therapy: Secondary | ICD-10-CM | POA: Diagnosis not present

## 2017-02-25 DIAGNOSIS — Z91040 Latex allergy status: Secondary | ICD-10-CM | POA: Diagnosis not present

## 2017-02-25 DIAGNOSIS — Z8249 Family history of ischemic heart disease and other diseases of the circulatory system: Secondary | ICD-10-CM | POA: Diagnosis not present

## 2017-02-25 DIAGNOSIS — Z7951 Long term (current) use of inhaled steroids: Secondary | ICD-10-CM | POA: Diagnosis not present

## 2017-02-25 DIAGNOSIS — Z809 Family history of malignant neoplasm, unspecified: Secondary | ICD-10-CM | POA: Diagnosis not present

## 2017-02-25 DIAGNOSIS — I10 Essential (primary) hypertension: Secondary | ICD-10-CM | POA: Diagnosis not present

## 2017-02-25 DIAGNOSIS — Z880 Allergy status to penicillin: Secondary | ICD-10-CM | POA: Insufficient documentation

## 2017-02-25 DIAGNOSIS — Z823 Family history of stroke: Secondary | ICD-10-CM | POA: Insufficient documentation

## 2017-02-25 DIAGNOSIS — Z90710 Acquired absence of both cervix and uterus: Secondary | ICD-10-CM | POA: Insufficient documentation

## 2017-02-25 HISTORY — PX: LAPAROSCOPIC SALPINGO OOPHERECTOMY: SHX5927

## 2017-02-25 HISTORY — PX: LAPAROTOMY: SHX154

## 2017-02-25 LAB — ABO/RH: ABO/RH(D): O POS

## 2017-02-25 SURGERY — SALPINGO-OOPHORECTOMY, LAPAROSCOPIC
Anesthesia: General | Laterality: Right | Wound class: Clean Contaminated

## 2017-02-25 MED ORDER — FENTANYL CITRATE (PF) 250 MCG/5ML IJ SOLN
INTRAMUSCULAR | Status: AC
  Filled 2017-02-25: qty 5

## 2017-02-25 MED ORDER — FAMOTIDINE 20 MG PO TABS
ORAL_TABLET | ORAL | Status: AC
  Filled 2017-02-25: qty 1

## 2017-02-25 MED ORDER — SUGAMMADEX SODIUM 200 MG/2ML IV SOLN
INTRAVENOUS | Status: AC
  Filled 2017-02-25: qty 2

## 2017-02-25 MED ORDER — ROCURONIUM BROMIDE 50 MG/5ML IV SOLN
INTRAVENOUS | Status: AC
  Filled 2017-02-25: qty 1

## 2017-02-25 MED ORDER — FENTANYL CITRATE (PF) 100 MCG/2ML IJ SOLN
INTRAMUSCULAR | Status: DC | PRN
  Administered 2017-02-25: 100 ug via INTRAVENOUS

## 2017-02-25 MED ORDER — MIDAZOLAM HCL 2 MG/2ML IJ SOLN
INTRAMUSCULAR | Status: AC
  Filled 2017-02-25: qty 2

## 2017-02-25 MED ORDER — EPHEDRINE SULFATE 50 MG/ML IJ SOLN
INTRAMUSCULAR | Status: DC | PRN
  Administered 2017-02-25: 10 mg via INTRAVENOUS

## 2017-02-25 MED ORDER — FAMOTIDINE 20 MG PO TABS
20.0000 mg | ORAL_TABLET | Freq: Once | ORAL | Status: AC
  Administered 2017-02-25: 20 mg via ORAL

## 2017-02-25 MED ORDER — ONDANSETRON HCL 4 MG/2ML IJ SOLN
INTRAMUSCULAR | Status: DC | PRN
  Administered 2017-02-25: 4 mg via INTRAVENOUS

## 2017-02-25 MED ORDER — ONDANSETRON HCL 4 MG/2ML IJ SOLN: 4.0000 mg | Freq: Once | INTRAMUSCULAR | Status: DC | PRN

## 2017-02-25 MED ORDER — PROPOFOL 10 MG/ML IV BOLUS
INTRAVENOUS | Status: AC
  Filled 2017-02-25: qty 20

## 2017-02-25 MED ORDER — FENTANYL CITRATE (PF) 100 MCG/2ML IJ SOLN
INTRAMUSCULAR | Status: AC
  Administered 2017-02-25: 25 ug via INTRAVENOUS
  Filled 2017-02-25: qty 2

## 2017-02-25 MED ORDER — SODIUM CHLORIDE 0.9 % IJ SOLN
INTRAMUSCULAR | Status: AC
  Filled 2017-02-25: qty 50

## 2017-02-25 MED ORDER — ESTRADIOL 1 MG PO TABS: 1.0000 mg | ORAL_TABLET | Freq: Every day | ORAL | 0 refills | Status: DC

## 2017-02-25 MED ORDER — KETOROLAC TROMETHAMINE 30 MG/ML IJ SOLN
30.0000 mg | Freq: Once | INTRAMUSCULAR | Status: AC
  Administered 2017-02-25: 30 mg via INTRAVENOUS

## 2017-02-25 MED ORDER — ROCURONIUM BROMIDE 100 MG/10ML IV SOLN
INTRAVENOUS | Status: DC | PRN
  Administered 2017-02-25: 30 mg via INTRAVENOUS
  Administered 2017-02-25: 10 mg via INTRAVENOUS

## 2017-02-25 MED ORDER — FENTANYL CITRATE (PF) 100 MCG/2ML IJ SOLN
25.0000 ug | INTRAMUSCULAR | Status: DC | PRN
  Administered 2017-02-25 (×5): 25 ug via INTRAVENOUS

## 2017-02-25 MED ORDER — PROPOFOL 10 MG/ML IV BOLUS
INTRAVENOUS | Status: DC | PRN
  Administered 2017-02-25: 140 mg via INTRAVENOUS

## 2017-02-25 MED ORDER — DEXAMETHASONE SODIUM PHOSPHATE 10 MG/ML IJ SOLN
INTRAMUSCULAR | Status: AC
  Filled 2017-02-25: qty 1

## 2017-02-25 MED ORDER — SUGAMMADEX SODIUM 200 MG/2ML IV SOLN
INTRAVENOUS | Status: DC | PRN
  Administered 2017-02-25: 150 mg via INTRAVENOUS

## 2017-02-25 MED ORDER — BUPIVACAINE HCL 0.5 % IJ SOLN
INTRAMUSCULAR | Status: DC | PRN
  Administered 2017-02-25: 16 mL

## 2017-02-25 MED ORDER — FENTANYL CITRATE (PF) 100 MCG/2ML IJ SOLN
INTRAMUSCULAR | Status: AC
  Filled 2017-02-25: qty 2

## 2017-02-25 MED ORDER — LACTATED RINGERS IV SOLN
INTRAVENOUS | Status: DC
  Administered 2017-02-25 (×3): via INTRAVENOUS

## 2017-02-25 MED ORDER — VASOPRESSIN 20 UNIT/ML IV SOLN
INTRAVENOUS | Status: AC
  Filled 2017-02-25: qty 1

## 2017-02-25 MED ORDER — MIDAZOLAM HCL 2 MG/2ML IJ SOLN
INTRAMUSCULAR | Status: DC | PRN
  Administered 2017-02-25: 2 mg via INTRAVENOUS

## 2017-02-25 MED ORDER — ONDANSETRON HCL 4 MG/2ML IJ SOLN
INTRAMUSCULAR | Status: AC
  Filled 2017-02-25: qty 2

## 2017-02-25 MED ORDER — BUPIVACAINE HCL (PF) 0.5 % IJ SOLN
INTRAMUSCULAR | Status: AC
  Filled 2017-02-25: qty 30

## 2017-02-25 MED ORDER — LIDOCAINE HCL (CARDIAC) 20 MG/ML IV SOLN
INTRAVENOUS | Status: DC | PRN
  Administered 2017-02-25: 60 mg via INTRAVENOUS

## 2017-02-25 MED ORDER — KETOROLAC TROMETHAMINE 30 MG/ML IJ SOLN
INTRAMUSCULAR | Status: AC
  Filled 2017-02-25: qty 1

## 2017-02-25 SURGICAL SUPPLY — 67 items
ADH LQ OCL WTPRF AMP STRL LF (MISCELLANEOUS) ×2
ADHESIVE MASTISOL STRL (MISCELLANEOUS) ×3 IMPLANT
ANCHOR TIS RET SYS 235ML (MISCELLANEOUS) ×3 IMPLANT
BAG DECANTER FOR FLEXI CONT (MISCELLANEOUS) ×3 IMPLANT
BAG TISS RTRVL C235 10X14 (MISCELLANEOUS) ×2
BLADE SURG 15 STRL SS SAFETY (BLADE) ×3 IMPLANT
BLADE SURG SZ11 CARB STEEL (BLADE) ×3 IMPLANT
CANISTER SUCT 1200ML W/VALVE (MISCELLANEOUS) ×3 IMPLANT
CATH ROBINSON RED A/P 16FR (CATHETERS) ×3 IMPLANT
CATH TRAY 16F METER LATEX (MISCELLANEOUS) ×3 IMPLANT
CHLORAPREP W/TINT 26ML (MISCELLANEOUS) ×3 IMPLANT
CLEANER CAUTERY TIP 5X5 PAD (MISCELLANEOUS) ×2 IMPLANT
CORD MONOPOLAR M/FML 12FT (MISCELLANEOUS) IMPLANT
COVER MAYO STAND STRL (DRAPES) ×3 IMPLANT
DRAPE LEGGINS SURG 28X43 STRL (DRAPES) ×3 IMPLANT
DRAPE XRAY CASSETTE 23X24 (DRAPES) ×3 IMPLANT
DRSG TELFA 3X8 NADH (GAUZE/BANDAGES/DRESSINGS) ×3 IMPLANT
ELECT REM PT RETURN 9FT ADLT (ELECTROSURGICAL) ×3
ELECTRODE REM PT RTRN 9FT ADLT (ELECTROSURGICAL) ×2 IMPLANT
GAUZE SPONGE 4X4 12PLY STRL (GAUZE/BANDAGES/DRESSINGS) ×3 IMPLANT
GLOVE BIO SURGEON STRL SZ8 (GLOVE) ×3 IMPLANT
GOWN STRL REUS W/ TWL LRG LVL3 (GOWN DISPOSABLE) ×4 IMPLANT
GOWN STRL REUS W/TWL LRG LVL3 (GOWN DISPOSABLE) ×6
GOWN STRL REUS W/TWL XL LVL3 (GOWN DISPOSABLE) ×3 IMPLANT
HANDLE YANKAUER SUCT BULB TIP (MISCELLANEOUS) ×3 IMPLANT
IRRIGATION STRYKERFLOW (MISCELLANEOUS) ×2 IMPLANT
IRRIGATOR STRYKERFLOW (MISCELLANEOUS) ×3
IV LACTATED RINGERS 1000ML (IV SOLUTION) ×3 IMPLANT
KIT PINK PAD W/HEAD ARE REST (MISCELLANEOUS) ×3
KIT PINK PAD W/HEAD ARM REST (MISCELLANEOUS) ×2 IMPLANT
KIT RM TURNOVER CYSTO AR (KITS) ×3 IMPLANT
KIT RM TURNOVER STRD PROC AR (KITS) ×3 IMPLANT
LABEL OR SOLS (LABEL) ×3 IMPLANT
LIGASURE LAP MARYLAND 5MM 37CM (ELECTROSURGICAL) ×3 IMPLANT
NEEDLE HYPO 22GX1.5 SAFETY (NEEDLE) ×3 IMPLANT
NS IRRIG 1000ML POUR BTL (IV SOLUTION) ×3 IMPLANT
NS IRRIG 500ML POUR BTL (IV SOLUTION) ×3 IMPLANT
PACK BASIN MAJOR ARMC (MISCELLANEOUS) ×3 IMPLANT
PACK GYN LAPAROSCOPIC (MISCELLANEOUS) ×3 IMPLANT
PAD CLEANER CAUTERY TIP 5X5 (MISCELLANEOUS) ×1
PAD OB MATERNITY 4.3X12.25 (PERSONAL CARE ITEMS) ×3 IMPLANT
PAD PREP 24X41 OB/GYN DISP (PERSONAL CARE ITEMS) ×3 IMPLANT
POUCH ENDO CATCH 10MM SPEC (MISCELLANEOUS) IMPLANT
SCISSORS METZENBAUM CVD 33 (INSTRUMENTS) IMPLANT
SLEEVE ENDOPATH XCEL 5M (ENDOMECHANICALS) ×3 IMPLANT
SPONGE LAP 18X18 5 PK (GAUZE/BANDAGES/DRESSINGS) ×3 IMPLANT
SPONGE XRAY 4X4 16PLY STRL (MISCELLANEOUS) ×3 IMPLANT
STAPLER SKIN PROX 35W (STAPLE) ×3 IMPLANT
STRIP CLOSURE SKIN 1/2X4 (GAUZE/BANDAGES/DRESSINGS) ×3 IMPLANT
SUT CHROMIC 0 CT 1 (SUTURE) ×6 IMPLANT
SUT CHROMIC GUT 1-0 18 CT-1 (SUTURE) ×6 IMPLANT
SUT MAXON ABS #0 GS21 30IN (SUTURE) ×6 IMPLANT
SUT VIC AB 0 CT1 18XCR BRD 8 (SUTURE) ×2 IMPLANT
SUT VIC AB 0 CT1 36 (SUTURE) ×9 IMPLANT
SUT VIC AB 0 CT1 8-18 (SUTURE) ×3
SUT VIC AB 3-0 SH 27 (SUTURE)
SUT VIC AB 3-0 SH 27X BRD (SUTURE) IMPLANT
SUT VICRYL 0 AB UR-6 (SUTURE) ×3 IMPLANT
SUT VICRYL 0 UR6 27IN ABS (SUTURE) ×6 IMPLANT
SYR 3ML LL SCALE MARK (SYRINGE) ×3 IMPLANT
SYRINGE 10CC LL (SYRINGE) ×3 IMPLANT
TOWEL OR 17X26 4PK STRL BLUE (TOWEL DISPOSABLE) ×3 IMPLANT
TROCAR ENDO BLADELESS 11MM (ENDOMECHANICALS) ×3 IMPLANT
TROCAR XCEL NON-BLD 5MMX100MML (ENDOMECHANICALS) ×3 IMPLANT
TROCAR XCEL UNIV SLVE 11M 100M (ENDOMECHANICALS) ×3 IMPLANT
TUBING CONNECTING 10 (TUBING) ×3 IMPLANT
TUBING INSUFFLATOR HI FLOW (MISCELLANEOUS) ×3 IMPLANT

## 2017-02-25 NOTE — Anesthesia Postprocedure Evaluation (Signed)
Anesthesia Post Note  Patient: Tanya Hill  Procedure(s) Performed: Procedure(s) (LRB): LAPAROSCOPIC RIGHT SALPINGO OOPHORECTOMY (Right) LAPAROTOMY (N/A)  Patient location during evaluation: PACU Anesthesia Type: General Level of consciousness: awake and alert Pain management: pain level controlled Vital Signs Assessment: post-procedure vital signs reviewed and stable Respiratory status: spontaneous breathing, nonlabored ventilation, respiratory function stable and patient connected to nasal cannula oxygen Cardiovascular status: blood pressure returned to baseline and stable Postop Assessment: no signs of nausea or vomiting Anesthetic complications: no     Last Vitals:  Vitals:   02/25/17 0955 02/25/17 1041  BP: 131/72 123/77  Pulse: 70 71  Resp: 16 16  Temp: 36.7 C     Last Pain:  Vitals:   02/25/17 1041  TempSrc:   PainSc: 3                  Raileigh Sabater S

## 2017-02-25 NOTE — Op Note (Signed)
     OPERATIVE NOTE 02/25/2017 8:53 AM  PRE-OPERATIVE DIAGNOSIS:  1) CYST OF RIGHT OVARY, PELVIC PAIN  POST-OPERATIVE DIAGNOSIS:  1) Same - benign appearing R ovarian cyst. 2) No evidence of Endometriosis  OPERATION:  LAPAROSCOPIC RIGHT SALPINGO OOPHORECTOMY:  SURGEON(S): Surgeon(s) and Role:    Linzie Collin* Candido Flott James, MD    * DeFrancesco, Daphine DeutscherMartin, MD   ANESTHESIA: General  ESTIMATED BLOOD LOSS: 40ml  OPERATIVE FINDINGS: s/p Hysterectomy and LSO - No E-osis noted.  Benign appearing right ovarian cyst.  SPECIMEN:  ID Type Source Tests Collected by Time Destination  1 : RIGHT OVARY AND TUBE  Tissue [facility] benign resection SURGICAL PATHOLOGY Linzie CollinEvans, Nehemie Casserly James, MD 02/25/2017 0809   A : Right Ovarian Cyst Fluid  GYN [facility] biopsy CYTOLOGY - NON PAP Linzie CollinEvans, Narissa Beaufort James, MD 02/25/2017 [phone]     COMPLICATIONS: None  DISPOSITION: Stable to recovery room  DESCRIPTION OF PROCEDURE:      The patient was prepped and draped in the dorsolithotomy position and placed under general anesthesia. The bladder was emptied. Two moistened sponges on a ring forceps were placed vaginally.  After changing gloves, a small infraumbilical incision was made and a 5 mm trocar port was placed within the abdominopelvic cavity. The opening pressure was less than 7 mmHg.  Approximately 3 and 1/2 L of carbon dioxide gas was instilled within the abdominal pelvic cavity. The laparoscope was placed and the pelvis and abdomen were carefully inspected.  The right ureter was noted to be well out of the operative field.  A benign appearing right ovarian cyst was noted.   The infundibulopelvic ligament was identified, the tube and ovary and cyst were retracted medially and the ligament was triply coagulated and divided. Hemostasis of the ligament was noted. Using an Endo Catch bag the specimen was placed within the bag and removed through the left lower quadrant port. The bag was intact at the time of removal without spillage  into the abdomen. Some fluid was collected separately and sent for cytology. Hemostasis of all areas of the pelvis was noted. The ports were removed under direct visualization.  Hemostasis was noted.  The laparoscope was removed the trocar sleeve was removed and the incision was closed with a deep suture through the fascia of 0 Vicryl then followed by subcuticular closure of the skin for all port sites. A long-acting anesthetic was injected.  Steri-Strips were applied. The sponges were removed from the vagina.  The patient went to the recovery room in stable condition.  Elonda Huskyavid J. Crystal Ellwood, M.D. 02/25/2017 8:53 AM

## 2017-02-25 NOTE — Anesthesia Procedure Notes (Signed)
Procedure Name: Intubation Date/Time: 02/25/2017 7:45 AM Performed by: Allean Found Pre-anesthesia Checklist: Patient identified, Emergency Drugs available, Suction available, Patient being monitored and Timeout performed Patient Re-evaluated:Patient Re-evaluated prior to inductionOxygen Delivery Method: Circle system utilized Preoxygenation: Pre-oxygenation with 100% oxygen Intubation Type: IV induction Ventilation: Mask ventilation without difficulty Laryngoscope Size: Mac and 3 Grade View: Grade II Tube type: Oral Tube size: 7.0 mm Number of attempts: 1 Airway Equipment and Method: Stylet Placement Confirmation: ETT inserted through vocal cords under direct vision,  positive ETCO2 and breath sounds checked- equal and bilateral Secured at: 21 cm Tube secured with: Tape Dental Injury: Teeth and Oropharynx as per pre-operative assessment

## 2017-02-25 NOTE — Interval H&P Note (Signed)
History and Physical Interval Note:  02/25/2017 7:26 AM  Tanya Hill  has presented today for surgery, with the diagnosis of CYST OF RIGHT OVARY, PELVIC PAIN  The various methods of treatment have been discussed with the patient and family. After consideration of risks, benefits and other options for treatment, the patient has consented to  Procedure(s): LAPAROSCOPIC RIGHT SALPINGO OOPHORECTOMY (Right) LAPAROTOMY (N/A) as a surgical intervention .  The patient's history has been reviewed, patient examined, no change in status, stable for surgery.  I have reviewed the patient's chart and labs.  Questions were answered to the patient's satisfaction.     Brennan Baileyavid Sayuri Rhames

## 2017-02-25 NOTE — Anesthesia Preprocedure Evaluation (Signed)
Anesthesia Evaluation  Patient identified by MRN, date of birth, ID band Patient awake    Reviewed: Allergy & Precautions, NPO status , Patient's Chart, lab work & pertinent test results, reviewed documented beta blocker date and time   Airway Mallampati: II  TM Distance: >3 FB     Dental  (+) Chipped   Pulmonary           Cardiovascular hypertension, Pt. on medications and Pt. on home beta blockers      Neuro/Psych    GI/Hepatic   Endo/Other    Renal/GU      Musculoskeletal   Abdominal   Peds  Hematology   Anesthesia Other Findings   Reproductive/Obstetrics                             Anesthesia Physical Anesthesia Plan  ASA: II  Anesthesia Plan: General   Post-op Pain Management:    Induction: Intravenous  Airway Management Planned: Oral ETT  Additional Equipment:   Intra-op Plan:   Post-operative Plan:   Informed Consent: I have reviewed the patients History and Physical, chart, labs and discussed the procedure including the risks, benefits and alternatives for the proposed anesthesia with the patient or authorized representative who has indicated his/her understanding and acceptance.     Plan Discussed with: CRNA  Anesthesia Plan Comments:         Anesthesia Quick Evaluation

## 2017-02-25 NOTE — OR Nursing (Signed)
Nausea improved with peppermint oil and fluid bolus.  No vomiting.

## 2017-02-25 NOTE — H&P (View-Only) (Signed)
     PRE-OPERATIVE HISTORY AND PHYSICAL EXAM  PCP:  GRISSO,GREG, MD Subjective:   HPI:  Tanya Hill is a 51 y.o. G2P2002.  No LMP recorded. Patient has had a hysterectomy.  She presents today for a pre-op discussion and PE.  She has the following symptoms:  Pelvic pain-ovarian cyst.  Review of Systems:   Constitutional: Denied constitutional symptoms, night sweats, recent illness, fatigue, fever, insomnia and weight loss.  Eyes: Denied eye symptoms, eye pain, photophobia, vision change and visual disturbance.  Ears/Nose/Throat/Neck: Denied ear, nose, throat or neck symptoms, hearing loss, nasal discharge, sinus congestion and sore throat.  Cardiovascular: Denied cardiovascular symptoms, arrhythmia, chest pain/pressure, edema, exercise intolerance, orthopnea and palpitations.  Respiratory: Denied pulmonary symptoms, asthma, pleuritic pain, productive sputum, cough, dyspnea and wheezing.  Gastrointestinal: Denied, gastro-esophageal reflux, melena, nausea and vomiting.  Genitourinary: Denied genitourinary symptoms including symptomatic vaginal discharge, pelvic relaxation issues, and urinary complaints.  Musculoskeletal: Denied musculoskeletal symptoms, stiffness, swelling, muscle weakness and myalgia.  Dermatologic: Denied dermatology symptoms, rash and scar.  Neurologic: Denied neurology symptoms, dizziness, headache, neck pain and syncope.  Psychiatric: Denied psychiatric symptoms, anxiety and depression.  Endocrine: Denied endocrine symptoms including hot flashes and night sweats.   OB History  Gravida Para Term Preterm AB Living  2 2 2     2  SAB TAB Ectopic Multiple Live Births          2    # Outcome Date GA Lbr Len/2nd Weight Sex Delivery Anes PTL Lv  2 Term 1992   9 lb 8 oz (4.309 kg) F Vag-Spont  N LIV  1 Term 1990   9 lb 1 oz (4.111 kg) F CS-Unspec  N LIV      Past Medical History:  Diagnosis Date  . Endometriosis   . Hypertension     Past Surgical History:    Procedure Laterality Date  . ABDOMINAL HYSTERECTOMY  1995  . OOPHORECTOMY        SOCIAL HISTORY: History  Smoking Status  . Never Smoker  Smokeless Tobacco  . Never Used   History  Alcohol Use No   History  Drug Use No    Family History  Problem Relation Age of Onset  . Hypertension Mother   . Stroke Mother   . Cancer Father   . Hyperlipidemia Father     ALLERGIES:  Latex and Penicillins  MEDS:   Current Outpatient Prescriptions on File Prior to Visit  Medication Sig Dispense Refill  . fluticasone (FLONASE) 50 MCG/ACT nasal spray Place 1 spray into both nostrils daily.    . loratadine (CLARITIN) 10 MG tablet Take 10 mg by mouth daily.    . metoprolol succinate (TOPROL-XL) 25 MG 24 hr tablet Take 25 mg by mouth daily.  5   No current facility-administered medications on file prior to visit.     No orders of the defined types were placed in this encounter.    Physical examination BP 115/69   Pulse 75   Ht 5' 5" (1.651 m)   Wt 155 lb 5 oz (70.4 kg)   BMI 25.85 kg/m   General NAD, Conversant  HEENT Atraumatic; Op clear with mmm.  Normo-cephalic. Pupils reactive. Anicteric sclerae  Thyroid/Neck Smooth without nodularity or enlargement. Normal ROM.  Neck Supple.  Skin No rashes, lesions or ulceration. Normal palpated skin turgor. No nodularity.  Breasts: No masses or discharge.  Symmetric.  No axillary adenopathy.  Lungs: Clear to auscultation.No rales or wheezes.   Normal Respiratory effort, no retractions.  Heart: NSR.  No murmurs or rubs appreciated. No periferal edema  Abdomen: Soft.  Non-tender.  No masses.  No HSM. No hernia  Extremities: Moves all appropriately.  Normal ROM for age. No lymphadenopathy.  Neuro: Oriented to PPT.  Normal mood. Normal affect.     Pelvic:   Vulva: Normal appearance.  No lesions.  Vagina: No lesions or abnormalities noted.  Support: Normal pelvic support.  Urethra No masses tenderness or scarring.  Meatus Normal size  without lesions or prolapse.  Cervix: Surgically absent   Anus: Normal exam.  No lesions.  Perineum: Normal exam.  No lesions.        Bimanual   Uterus: Surgically absent   Adnexae: No masses palpated.  Some right-sided pelvic pain.   Cul-de-sac: Negative for abnormality.   Ultrasound reveals a cystic pelvic mass consistent with ovarian cyst.  Assessment:   G2P2002   1. Cyst of right ovary   2. Pelvic pain in female     Low risk of malignancy based on appearance and ROMA testing.  This was done in consultation with Dr. Baird KaySeccord.  Plan:   1.  Laparoscopic oophorectomy to include fallopian tube if present.  possible laparotomy should extensive pelvic disease or adhesions be noted.    Elonda Huskyavid J. Evans, M.D. 02/05/2017 4:21 PM

## 2017-02-25 NOTE — Discharge Instructions (Signed)

## 2017-02-25 NOTE — Anesthesia Post-op Follow-up Note (Cosign Needed)
Anesthesia QCDR form completed.        

## 2017-02-25 NOTE — Transfer of Care (Signed)
Immediate Anesthesia Transfer of Care Note  Patient: Tanya Hill  Procedure(s) Performed: Procedure(s): LAPAROSCOPIC RIGHT SALPINGO OOPHORECTOMY (Right) LAPAROTOMY (N/A)  Patient Location: PACU  Anesthesia Type:General  Level of Consciousness: awake  Airway & Oxygen Therapy: Patient Spontanous Breathing and Patient connected to face mask oxygen  Post-op Assessment: Report given to RN and Post -op Vital signs reviewed and stable  Post vital signs: Reviewed and stable  Last Vitals:  Vitals:   02/25/17 0610 02/25/17 0900  BP: 127/85 (!) 133/93  Pulse: 65 70  Resp: 12 11  Temp: 36.9 C 36.2 C    Last Pain:  Vitals:   02/25/17 0610  TempSrc: Oral         Complications: No apparent anesthesia complications

## 2017-02-26 LAB — CYTOLOGY - NON PAP

## 2017-02-26 LAB — SURGICAL PATHOLOGY

## 2017-03-05 ENCOUNTER — Encounter: Payer: Self-pay | Admitting: Obstetrics and Gynecology

## 2017-03-05 ENCOUNTER — Ambulatory Visit (INDEPENDENT_AMBULATORY_CARE_PROVIDER_SITE_OTHER): Payer: BLUE CROSS/BLUE SHIELD | Admitting: Obstetrics and Gynecology

## 2017-03-05 VITALS — BP 107/70 | HR 63 | Wt 158.3 lb

## 2017-03-05 DIAGNOSIS — J040 Acute laryngitis: Secondary | ICD-10-CM

## 2017-03-05 DIAGNOSIS — N83209 Unspecified ovarian cyst, unspecified side: Secondary | ICD-10-CM | POA: Diagnosis not present

## 2017-03-05 DIAGNOSIS — Z9889 Other specified postprocedural states: Secondary | ICD-10-CM

## 2017-03-05 DIAGNOSIS — N83201 Unspecified ovarian cyst, right side: Secondary | ICD-10-CM | POA: Diagnosis not present

## 2017-03-05 NOTE — Progress Notes (Signed)
HPI:      [name] is a 51 y.o. [phone] who LMP was No LMP recorded. Patient has had a hysterectomy.  Subjective:   She presents today 1 week postop oophorectomy. She is doing well with the exception of having an upper respiratory infection with cough and congestion. She went to urgent care and was treated with one dose of steroids. Her chest x-ray was negative for abnormality. She says that she has somewhat improved since that visit but her main issue is difficulty sleeping because of the congestion.  She has not yet tried any decongestants. She reports no problems with her abdomen-denies any abdominal or pelvic pain. Normal bowel movements and normal urination.   Hx: The following portions of the patient's history were reviewed and updated as appropriate:             She  has a past medical history of Endometriosis and Hypertension. She  does not have a problem list on file. She  has a past surgical history that includes Oophorectomy (Left); Abdominal hysterectomy (2008); Cesarean section (1990); Laparoscopic salpingo oophorectomy (Right, 02/25/2017); and laparotomy (N/A, 02/25/2017). Her family history includes Cancer in her father; Hyperlipidemia in her father; Hypertension in her mother; Stroke in her mother. She  reports that she has never smoked. She has never used smokeless tobacco. She reports that she does not drink alcohol or use drugs. She is allergic to latex and penicillins.       Review of Systems:  Review of Systems  Constitutional: Denied constitutional symptoms, night sweats, recent illness, fatigue, fever, insomnia and weight loss.  Eyes: Denied eye symptoms, eye pain, photophobia, vision change and visual disturbance.  Ears/Nose/Throat/Neck: Denied ear, nose, throat or neck symptoms, hearing loss, nasal discharge, sinus congestion and sore throat.  Cardiovascular: Denied cardiovascular symptoms, arrhythmia, chest pain/pressure, edema, exercise intolerance,  orthopnea and palpitations.  Respiratory: See HPI for additional information.  Gastrointestinal: Denied, gastro-esophageal reflux, melena, nausea and vomiting.  Genitourinary: Denied genitourinary symptoms including symptomatic vaginal discharge, pelvic relaxation issues, and urinary complaints.  Musculoskeletal: Denied musculoskeletal symptoms, stiffness, swelling, muscle weakness and myalgia.  Dermatologic: Denied dermatology symptoms, rash and scar.  Neurologic: Denied neurology symptoms, dizziness, headache, neck pain and syncope.  Psychiatric: Denied psychiatric symptoms, anxiety and depression.  Endocrine: Denied endocrine symptoms including hot flashes and night sweats.   Meds:   Current Outpatient Prescriptions on File Prior to Visit  Medication Sig Dispense Refill  . Cholecalciferol (VITAMIN D) 2000 units tablet Take 2,000 Units by mouth daily.    Marland Kitchen estradiol (ESTRACE) 1 MG tablet Take 1 tablet (1 mg total) by mouth daily. 30 tablet 0  . loratadine (CLARITIN) 10 MG tablet Take 10 mg by mouth daily.    . metoprolol succinate (TOPROL-XL) 25 MG 24 hr tablet Take 25 mg by mouth daily.  5  . Multiple Vitamins-Minerals (MULTIVITAMIN WITH MINERALS) tablet Take 1 tablet by mouth daily.     No current facility-administered medications on file prior to visit.     Objective:     Vitals:   03/05/17 0941  BP: 107/70  Pulse: 63               Abdomen: Soft.  Non-tender.  No masses.  No HSM.  Incision/s: Intact.  Healing well.  No erythema.  No drainage.      Assessment:    G2P2002 There are no active problems to display for this patient.    1. Post-operative state   2.  Cyst of right ovary   3. Hemorrhagic cyst of ovary   4. Acute laryngitis      Patient with URI.  Says some improvement in the last day.   Excellent recovery postop.  Pathology consistent with hemorrhagic cyst.   Plan:            1.   Discussed postop care in detail with patient. May resume normal  activities.  2.   Discussed symptomatic use of decongestants and sleep aids as well as vaporizer at night for URI.  Patient to contact us if she begins having fevers or chills as well as increased sputum production.  3.   Continue estradiol.    F/U  Return in about 5 weeks (around 04/09/2017). I spent 17 minutes with this patient of which greater than 50% was spent discussing  Her pathology, her URI and acute care visit, symptomatic treatment, ERT.  Elonda Huskyavid J. Cristiano Capri, M.D. 03/05/2017 10:23 AM

## 2017-03-20 ENCOUNTER — Telehealth: Payer: Self-pay | Admitting: Obstetrics and Gynecology

## 2017-03-20 NOTE — Telephone Encounter (Signed)
°  Patient would like a refill on estradiol (ESTRACE) 1 MG tablet, And the patient expressed that she would like the medication changed to 90/60 day supply. Patient would like to speak with Jasmine DecemberSharon or Dr. Logan BoresEvans. Please advise.

## 2017-03-22 ENCOUNTER — Other Ambulatory Visit: Payer: Self-pay

## 2017-03-22 MED ORDER — ESTRADIOL 1 MG PO TABS: 1.0000 mg | ORAL_TABLET | Freq: Every day | ORAL | 0 refills | Status: DC

## 2017-03-27 ENCOUNTER — Other Ambulatory Visit: Payer: Self-pay

## 2017-03-27 ENCOUNTER — Telehealth: Payer: Self-pay | Admitting: Obstetrics and Gynecology

## 2017-03-27 MED ORDER — ESTRADIOL 1 MG PO TABS: 1.0000 mg | ORAL_TABLET | Freq: Every day | ORAL | 0 refills | Status: DC

## 2017-03-27 NOTE — Telephone Encounter (Signed)
Escribed Estrace again. Received confirmation by pharmacy. Pt informed- also asked pt to call back if pharmacy states they didn't get it and this writer would phone it in.

## 2017-03-27 NOTE — Telephone Encounter (Signed)
[facility] and they do not have a script there for the Estrace - please send refill asap and notify patient

## 2017-04-01 ENCOUNTER — Ambulatory Visit
Admission: RE | Admit: 2017-04-01 | Discharge: 2017-04-01 | Disposition: A | Discharge status: Home / Self Care | Payer: BLUE CROSS/BLUE SHIELD | Source: Ambulatory Visit | Attending: Nurse Practitioner | Admitting: Nurse Practitioner

## 2017-04-01 ENCOUNTER — Encounter: Payer: Self-pay | Admitting: Nurse Practitioner

## 2017-04-01 ENCOUNTER — Ambulatory Visit (INDEPENDENT_AMBULATORY_CARE_PROVIDER_SITE_OTHER): Payer: BLUE CROSS/BLUE SHIELD | Admitting: Nurse Practitioner

## 2017-04-01 VITALS — BP 97/62 | HR 79 | Temp 98.6°F | Ht 64.0 in | Wt 158.0 lb

## 2017-04-01 DIAGNOSIS — R05 Cough: Secondary | ICD-10-CM

## 2017-04-01 DIAGNOSIS — E049 Nontoxic goiter, unspecified: Secondary | ICD-10-CM | POA: Diagnosis not present

## 2017-04-01 DIAGNOSIS — R059 Cough, unspecified: Secondary | ICD-10-CM

## 2017-04-01 MED ORDER — AZITHROMYCIN 250 MG PO TABS: ORAL_TABLET | ORAL | 0 refills | Status: DC

## 2017-04-01 MED ORDER — METOPROLOL SUCCINATE ER 25 MG PO TB24: 12.5000 mg | ORAL_TABLET | Freq: Every day | ORAL | 5 refills | Status: DC

## 2017-04-01 NOTE — Patient Instructions (Signed)
Tanya Hill, Thank you for coming in to clinic today.  1. You likely have pneumonia.  We are getting a chest x ray to confirm. - I will also go ahead and treat you with an antibiotic. - Take azithromycin 250 mg 1 tablet twice today, then once daily for the next 4 days.  Please schedule a follow-up appointment with Wilhelmina McardleLauren Amauria Younts, AGNP to Return 5-7 if symptoms worsen or fail to improve, for and in early March 2019 for your annual physical.  If you have any other questions or concerns, please feel free to call the clinic or send a message through MyChart. You may also schedule an earlier appointment if necessary.  Wilhelmina McardleLauren Roe Koffman, DNP, AGNP-BC Adult Gerontology Nurse Practitioner [facility]

## 2017-04-01 NOTE — Progress Notes (Signed)
Subjective:    Patient ID: Tanya Hill, female    DOB: [birth year], 51 y.o.   MRN: [id number]  Tanya Hill is a 51 y.o. female presenting on 04/01/2017 for Sore Throat (x 4 weeks. The pt was seen in Urgent Care x 2 weeks ago. They gave her a steroid injection. Some of the symptoms improved, but sore throat persist ) and Cough   HPI Establish Care New Provider Pt last seen by PCP less than 1 year ago (February for physical).  Obtain records from Dr. Shary Decamp in [place] and [facility] - for mammogram  Sore Throat Fluid & solid ovarian cyst - Removed laparoscopically.  Pt states she was intubated in May for her surgery. After that, she had chest congestion, cough, was coughing up blood, and thick yellow secretions. She went to [facility] w/ chest X ray approx May 22nd for her symptoms about 1 week after her surgery, was given steroid and no antibiotic.    Yesterday she was having intermittent sore throat, w/ stabbing pain requiring 2 ibuprofen q 2 hours. Also has a lingering cough with occasional thick clear to white sputum.  Pt notes more nighttime drainage. Will occasionally wake at night with difficulty breathing w/ wheezing/stridor.  Pt also endorses significant fatigue.  Has been taking claritin daily.  Denies fever, chills, sweats, nausea, vomiting.  History of thyroid goiter w/ nodule First noticed 2 years ago and past biopsy done 2 years ago and was normal.  No repeat ultrasound or biopsy since.  She has not had recent TSH either.  Hypertension Worse hypertension w/ initial diagnosis about 1 year ago when she was going through a divorce and also in nursing school. Current treatment includes metoprolol XL 25 mg once daily.  She has noticed her BP consistently in the 90s / 60s.    Pt denies headache, lightheadedness, dizziness, changes in vision, chest tightness/pressure, palpitations, leg swelling, sudden loss of speech or loss of  consciousness.   Past Medical History:  Diagnosis Date  . Endometriosis   . 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 Hypertension 2017   Past Surgical History:  Procedure Laterality Date  . ABDOMINAL HYSTERECTOMY  2008  . CESAREAN SECTION  1990  . LAPAROSCOPIC SALPINGO OOPHERECTOMY Right 02/25/2017   Procedure: LAPAROSCOPIC RIGHT SALPINGO OOPHORECTOMY;  Surgeon: Tanya Collin, MD;  Location: [facility] ORS;  Service: Gynecology;  Laterality: Right;  . LAPAROTOMY N/A 02/25/2017   Procedure: LAPAROTOMY;  Surgeon: Tanya Collin, MD;  Location: [facility] ORS;  Service: Gynecology;  Laterality: N/A;  . OOPHORECTOMY Left    Social History   Social History  . Marital status: Divorced    Spouse name: N/A  . Number of children: N/A  . Years of education: N/A   Occupational History  . Not on file.   Social History Main Topics  . Smoking status: Never Smoker  . Smokeless tobacco: Never Used  . Alcohol use No  . Drug use: No  . Sexual activity: Not Currently   Other Topics Concern  . Not on file   Social History Narrative  . No narrative on file   Family History  Problem Relation Age of Onset  . Hypertension Mother   . Stroke Mother   . Cancer Father        Kidney, prostate  . Hyperlipidemia Father   . Healthy Brother    Current Outpatient Prescriptions on File Prior to Visit  Medication Sig  .  Cholecalciferol (VITAMIN D) 2000 units tablet Take 2,000 Units by mouth daily.  Marland Kitchen estradiol (ESTRACE) 1 MG tablet Take 1 tablet (1 mg total) by mouth daily.  Marland Kitchen loratadine (CLARITIN) 10 MG tablet Take 10 mg by mouth daily.  . Multiple Vitamins-Minerals (MULTIVITAMIN WITH MINERALS) tablet Take 1 tablet by mouth daily.   No current facility-administered medications on file prior to visit.     Review of Systems  All other systems reviewed and are negative.  Per HPI unless specifically indicated above      Objective:    BP 97/62 (BP Location: Right Arm, Patient Position:  Sitting, Cuff Size: Normal)   Pulse 79   Temp 98.6 F (37 C) (Oral)   Ht 5\' 4"  (1.626 m)   Wt 158 lb (71.7 kg)   BMI 27.12 kg/m   Wt Readings from Last 3 Encounters:  04/01/17 158 lb (71.7 kg)  03/05/17 158 lb 5 oz (71.8 kg)  02/25/17 155 lb (70.3 kg)    Physical Exam  Constitutional: She is oriented to person, place, and time. She appears well-developed and well-nourished. No distress.  HENT:  Head: Normocephalic and atraumatic.  Right Ear: Hearing, tympanic membrane, external ear and ear canal normal.  Left Ear: Hearing, tympanic membrane, external ear and ear canal normal.  Nose: Mucosal edema and septal deviation present. Right sinus exhibits no maxillary sinus tenderness and no frontal sinus tenderness. Left sinus exhibits no maxillary sinus tenderness and no frontal sinus tenderness.  Mouth/Throat: Posterior oropharyngeal erythema present. No oropharyngeal exudate, posterior oropharyngeal edema or tonsillar abscesses.  Eyes: Conjunctivae and EOM are normal. Pupils are equal, round, and reactive to light. Right eye exhibits no discharge. Left eye exhibits no discharge.  Neck: Normal range of motion. Neck supple. No JVD present. No tracheal deviation present. Thyromegaly present.  Cardiovascular: Normal rate, regular rhythm, normal heart sounds and intact distal pulses.   Pulmonary/Chest: Effort normal and breath sounds normal.      Lymphadenopathy:    She has cervical adenopathy.  Neurological: She is alert and oriented to person, place, and time. No cranial nerve deficit.  Skin: Skin is warm and dry.  Psychiatric: She has a normal mood and affect. Her behavior is normal. Judgment and thought content normal.  Vitals reviewed.   Results for orders placed or performed in visit on 04/01/17  TSH  Result Value Ref Range   TSH 0.84 mIU/L      Assessment & Plan:   Problem List Items Addressed This Visit      Endocrine   Goiter, nodular Prior thyroid ultrasound revealed  nodule.  Biopsy was negative for cancer.  Pt without current symptoms of hypothyroidism or compressive symptoms.  Plan: 1. Repeat thyroid US 2. Check TSH   Relevant Medications   metoprolol succinate (TOPROL-XL) 25 MG 24 hr tablet   Other Relevant Orders   US THYROID   TSH    Other Visit Diagnoses    Cough    -  Primary Pt w/ cough and fatigue.  No active fever, but pt with persistent cough since intubation in May.  Positive egophony of RML/RUL suggests possible pneumonia.  Plan: 1. Obtain 2 View CXR. 2. Treat empirically with azithromycin for pneumonia given time course of illness and opportunity for pathogen entry with intubation at surgery.  Timeline suggests possible ventilator associated symptoms.      Relevant Orders   DG Chest 2 View      Meds ordered this encounter  Medications  .  azithromycin (ZITHROMAX Z-PAK) 250 MG tablet    Sig: Take 1 tablet now, repeat at bedtime.  Take one tablet for 4 days.    Dispense:  6 each    Refill:  0    Order Specific Question:   Supervising Provider    Answer:   Smitty CordsKARAMALEGOS, ALEXANDER J [[id number]]  . metoprolol succinate (TOPROL-XL) 25 MG 24 hr tablet    Sig: Take 0.5 tablets (12.5 mg total) by mouth daily.    Dispense:  30 tablet    Refill:  5    Do not fill until pt requests    Order Specific Question:   Supervising Provider    Answer:   Smitty CordsKARAMALEGOS, ALEXANDER J [[id number]]      Follow up plan: Return 5-7 if symptoms worsen or fail to improve, for and in early March 2019 for your annual physical.  Wilhelmina McardleLauren Destanee Bedonie, DNP, AGPCNP-BC Adult Gerontology Primary Care Nurse Practitioner [facility] 04/05/2017, 5:01 PM

## 2017-04-03 LAB — TSH: TSH: 0.84 mIU/L

## 2017-04-08 NOTE — Progress Notes (Signed)
I have reviewed this encounter including the documentation in this note and/or discussed this patient with the provider, Wilhelmina McardleLauren Kennedy, AGPCNP-BC. I am certifying that I agree with the content of this note as supervising physician.  Saralyn PilarAlexander Jireh Elmore, DO [facility] 04/08/2017, 1:49 PM

## 2017-04-09 ENCOUNTER — Encounter: Payer: BLUE CROSS/BLUE SHIELD | Admitting: Obstetrics and Gynecology

## 2017-04-11 ENCOUNTER — Ambulatory Visit
Admission: RE | Admit: 2017-04-11 | Discharge: 2017-04-11 | Disposition: A | Discharge status: Home / Self Care | Payer: BLUE CROSS/BLUE SHIELD | Source: Ambulatory Visit | Attending: Nurse Practitioner | Admitting: Nurse Practitioner

## 2017-04-11 DIAGNOSIS — E049 Nontoxic goiter, unspecified: Secondary | ICD-10-CM | POA: Diagnosis present

## 2017-04-11 DIAGNOSIS — E042 Nontoxic multinodular goiter: Secondary | ICD-10-CM | POA: Insufficient documentation

## 2017-04-16 ENCOUNTER — Encounter: Payer: Self-pay | Admitting: Obstetrics and Gynecology

## 2017-04-16 ENCOUNTER — Ambulatory Visit (INDEPENDENT_AMBULATORY_CARE_PROVIDER_SITE_OTHER): Payer: BLUE CROSS/BLUE SHIELD | Admitting: Obstetrics and Gynecology

## 2017-04-16 VITALS — BP 101/64 | HR 70 | Ht 64.0 in | Wt 157.4 lb

## 2017-04-16 DIAGNOSIS — Z9889 Other specified postprocedural states: Secondary | ICD-10-CM

## 2017-04-16 DIAGNOSIS — N951 Menopausal and female climacteric states: Secondary | ICD-10-CM

## 2017-04-16 MED ORDER — ESTRADIOL 1 MG PO TABS: 1.0000 mg | ORAL_TABLET | Freq: Every day | ORAL | 0 refills | Status: DC

## 2017-04-16 NOTE — Progress Notes (Signed)
HPI:      [name] is a 51 y.o. [phone] who LMP was No LMP recorded. Patient has had a hysterectomy.  Subjective:   She presents today Partially 6 weeks from laparoscopic oophorectomy. Patient is taking Estrace as directed. Reports no problems. She says she is feeling much better. She has resumed work. Has occasional right-sided pain but not disabling.    Hx: The following portions of the patient's history were reviewed and updated as appropriate:             She  has a past medical history of Endometriosis; Goiter, nodular; and Hypertension (2017). She  does not have any pertinent problems on file. She  has a past surgical history that includes Oophorectomy (Left); Abdominal hysterectomy (2008); Cesarean section (1990); Laparoscopic salpingo oophorectomy (Right, 02/25/2017); and laparotomy (N/A, 02/25/2017). Her family history includes Cancer in her father; Healthy in her brother; Hyperlipidemia in her father; Hypertension in her mother; Stroke in her mother. She  reports that she has never smoked. She has never used smokeless tobacco. She reports that she does not drink alcohol or use drugs. She is allergic to latex and penicillins.       Review of Systems:  Review of Systems  Constitutional: Denied constitutional symptoms, night sweats, recent illness, fatigue, fever, insomnia and weight loss.  Eyes: Denied eye symptoms, eye pain, photophobia, vision change and visual disturbance.  Ears/Nose/Throat/Neck: Denied ear, nose, throat or neck symptoms, hearing loss, nasal discharge, sinus congestion and sore throat.  Cardiovascular: Denied cardiovascular symptoms, arrhythmia, chest pain/pressure, edema, exercise intolerance, orthopnea and palpitations.  Respiratory: Denied pulmonary symptoms, asthma, pleuritic pain, productive sputum, cough, dyspnea and wheezing.  Gastrointestinal: Denied, gastro-esophageal reflux, melena, nausea and vomiting.  Genitourinary: Denied genitourinary  symptoms including symptomatic vaginal discharge, pelvic relaxation issues, and urinary complaints.  Musculoskeletal: Denied musculoskeletal symptoms, stiffness, swelling, muscle weakness and myalgia.  Dermatologic: Denied dermatology symptoms, rash and scar.  Neurologic: Denied neurology symptoms, dizziness, headache, neck pain and syncope.  Psychiatric: Denied psychiatric symptoms, anxiety and depression.  Endocrine: Denied endocrine symptoms including hot flashes and night sweats.   Meds:   Current Outpatient Prescriptions on File Prior to Visit  Medication Sig Dispense Refill  . Cholecalciferol (VITAMIN D) 2000 units tablet Take 2,000 Units by mouth daily.    Marland Kitchen loratadine (CLARITIN) 10 MG tablet Take 10 mg by mouth daily.    . metoprolol succinate (TOPROL-XL) 25 MG 24 hr tablet Take 0.5 tablets (12.5 mg total) by mouth daily. 30 tablet 5  . Multiple Vitamins-Minerals (MULTIVITAMIN WITH MINERALS) tablet Take 1 tablet by mouth daily.    Marland Kitchen azithromycin (ZITHROMAX Z-PAK) 250 MG tablet Take 1 tablet now, repeat at bedtime.  Take one tablet for 4 days. (Patient not taking: Reported on 04/16/2017) 6 each 0   No current facility-administered medications on file prior to visit.     Objective:     Vitals:   04/16/17 1501  BP: 101/64  Pulse: 70               Abdomen: Soft.  Non-tender.  No masses.  No HSM.  Incision/s: Intact.  Healing well.  No erythema.  No drainage.   Physical examination   Pelvic:   Vulva: Normal appearance.  No lesions.  Vagina: No lesions or abnormalities noted.  Support: Normal pelvic support.  Urethra No masses tenderness or scarring.  Meatus Normal size without lesions or prolapse.  Cervix: Surgically absent   Anus: Normal exam.  No  lesions.  Perineum: Normal exam.  No lesions.        Bimanual   Uterus: Surgically absent   Adnexae: No masses.  Non-tender to palpation.  Cul-de-sac: Negative for abnormality.      Assessment:    [id number] Patient  Active Problem List   Diagnosis Date Noted  . Goiter, nodular 04/01/2017  . Hypertension 10/16/2015     1. Post-operative state   2. Symptomatic menopausal or female climacteric states     Symptoms resolved with ERT.   Plan:            1.  Patient may gradually resume exercise and normal activities.  2.  Continue Estrace. Orders No orders of the defined types were placed in this encounter.    Meds ordered this encounter  Medications  . estradiol (ESTRACE) 1 MG tablet    Sig: Take 1 tablet (1 mg total) by mouth daily.    Dispense:  90 tablet    Refill:  0        F/U  Return in about 3 months (around 07/17/2017).  Elonda Huskyavid J. Evans, M.D. 04/16/2017 3:34 PM

## 2017-06-18 ENCOUNTER — Other Ambulatory Visit: Payer: Self-pay | Admitting: Obstetrics and Gynecology

## 2017-06-19 MED ORDER — ESTRADIOL 1 MG PO TABS: 1.0000 mg | ORAL_TABLET | Freq: Every day | ORAL | 1 refills | Status: DC

## 2017-07-16 ENCOUNTER — Encounter: Payer: BLUE CROSS/BLUE SHIELD | Admitting: Obstetrics and Gynecology

## 2017-07-30 ENCOUNTER — Encounter: Payer: Self-pay | Admitting: Obstetrics and Gynecology

## 2017-07-30 ENCOUNTER — Ambulatory Visit (INDEPENDENT_AMBULATORY_CARE_PROVIDER_SITE_OTHER): Payer: BLUE CROSS/BLUE SHIELD | Admitting: Obstetrics and Gynecology

## 2017-07-30 VITALS — BP 119/81 | HR 71 | Ht 64.0 in | Wt 160.1 lb

## 2017-07-30 DIAGNOSIS — Z79890 Hormone replacement therapy (postmenopausal): Secondary | ICD-10-CM

## 2017-07-30 MED ORDER — ESTRADIOL 1 MG PO TABS: 1.0000 mg | ORAL_TABLET | Freq: Every day | ORAL | 2 refills | Status: DC

## 2017-07-30 NOTE — Progress Notes (Signed)
HPI:      [name] is a 51 y.o. [phone] who LMP was No LMP recorded. Patient has had a hysterectomy.  Subjective:   She presents today Approximately 5 months after her surgery. She reports that she is completely recovered and feels well. She has resumed normal activities. She had some questions regarding estrogen and would like a more detailed in longer explanation of the risks and benefits.    Hx: The following portions of the patient's history were reviewed and updated as appropriate:             She  has a past medical history of Endometriosis; Goiter, nodular; and Hypertension (2017). She  does not have any pertinent problems on file. She  has a past surgical history that includes Oophorectomy (Left); Abdominal hysterectomy (2008); Cesarean section (1990); Laparoscopic salpingo oophorectomy (Right, 02/25/2017); and laparotomy (N/A, 02/25/2017). Her family history includes Cancer in her father; Healthy in her brother; Hyperlipidemia in her father; Hypertension in her mother; Stroke in her mother. She  reports that she has never smoked. She has never used smokeless tobacco. She reports that she does not drink alcohol or use drugs. She is allergic to latex and penicillins.       Review of Systems:  Review of Systems  Constitutional: Denied constitutional symptoms, night sweats, recent illness, fatigue, fever, insomnia and weight loss.  Eyes: Denied eye symptoms, eye pain, photophobia, vision change and visual disturbance.  Ears/Nose/Throat/Neck: Denied ear, nose, throat or neck symptoms, hearing loss, nasal discharge, sinus congestion and sore throat.  Cardiovascular: Denied cardiovascular symptoms, arrhythmia, chest pain/pressure, edema, exercise intolerance, orthopnea and palpitations.  Respiratory: Denied pulmonary symptoms, asthma, pleuritic pain, productive sputum, cough, dyspnea and wheezing.  Gastrointestinal: Denied, gastro-esophageal reflux, melena, nausea and  vomiting.  Genitourinary: Denied genitourinary symptoms including symptomatic vaginal discharge, pelvic relaxation issues, and urinary complaints.  Musculoskeletal: Denied musculoskeletal symptoms, stiffness, swelling, muscle weakness and myalgia.  Dermatologic: Denied dermatology symptoms, rash and scar.  Neurologic: Denied neurology symptoms, dizziness, headache, neck pain and syncope.  Psychiatric: Denied psychiatric symptoms, anxiety and depression.  Endocrine: Denied endocrine symptoms including hot flashes and night sweats.   Meds:   Current Outpatient Prescriptions on File Prior to Visit  Medication Sig Dispense Refill  . Cholecalciferol (VITAMIN D) 2000 units tablet Take 2,000 Units by mouth daily.    Marland Kitchen loratadine (CLARITIN) 10 MG tablet Take 10 mg by mouth daily.    . metoprolol succinate (TOPROL-XL) 25 MG 24 hr tablet Take 0.5 tablets (12.5 mg total) by mouth daily. 30 tablet 5  . Multiple Vitamins-Minerals (MULTIVITAMIN WITH MINERALS) tablet Take 1 tablet by mouth daily.     No current facility-administered medications on file prior to visit.     Objective:     Vitals:   07/30/17 1018  BP: 119/81  Pulse: 71                Assessment:    G2P2002 Patient Active Problem List   Diagnosis Date Noted  . Goiter, nodular 04/01/2017  . Hypertension 10/16/2015     1. Postmenopausal hormone therapy        Plan:            1.  Patient plans to continue Estrace at the current dose. Orders No orders of the defined types were placed in this encounter.    Meds ordered this encounter  Medications  . estradiol (ESTRACE) 1 MG tablet    Sig: Take 1 tablet (1  mg total) by mouth daily.    Dispense:  90 tablet    Refill:  2      F/U  No Follow-up on file. I spent 18 minutes with this patient of which greater than 50% was spent discussing hormone replacement therapy, estrogen replacement therapy, the patient had a number of questions regarding current use of estrogen  and the time period for continuation of estrogen. She stated that her mother stopped taking it and was doing fine and wondered if she should continue it herself. We discussed the current literature on ERT including some past literature and dissection of the WHI study.   Elonda Husky, M.D. 07/30/2017 12:02 PM

## 2018-02-25 ENCOUNTER — Encounter: Payer: BLUE CROSS/BLUE SHIELD | Admitting: Obstetrics and Gynecology

## 2018-03-11 ENCOUNTER — Encounter: Payer: BLUE CROSS/BLUE SHIELD | Admitting: Obstetrics and Gynecology

## 2018-03-13 ENCOUNTER — Other Ambulatory Visit: Payer: Self-pay

## 2018-03-13 ENCOUNTER — Encounter: Payer: Self-pay | Admitting: Nurse Practitioner

## 2018-03-13 ENCOUNTER — Encounter: Payer: BLUE CROSS/BLUE SHIELD | Admitting: Obstetrics and Gynecology

## 2018-03-13 ENCOUNTER — Ambulatory Visit (INDEPENDENT_AMBULATORY_CARE_PROVIDER_SITE_OTHER): Payer: BLUE CROSS/BLUE SHIELD | Admitting: Nurse Practitioner

## 2018-03-13 VITALS — BP 101/62 | HR 74 | Temp 98.2°F | Ht 64.0 in | Wt 159.8 lb

## 2018-03-13 DIAGNOSIS — Z1211 Encounter for screening for malignant neoplasm of colon: Secondary | ICD-10-CM | POA: Diagnosis not present

## 2018-03-13 DIAGNOSIS — E2839 Other primary ovarian failure: Secondary | ICD-10-CM | POA: Diagnosis not present

## 2018-03-13 DIAGNOSIS — I10 Essential (primary) hypertension: Secondary | ICD-10-CM | POA: Diagnosis not present

## 2018-03-13 DIAGNOSIS — Z0000 Encounter for general adult medical examination without abnormal findings: Secondary | ICD-10-CM

## 2018-03-13 DIAGNOSIS — Z79890 Hormone replacement therapy (postmenopausal): Secondary | ICD-10-CM | POA: Diagnosis not present

## 2018-03-13 MED ORDER — ESTRADIOL 0.5 MG PO TABS: 0.7500 mg | ORAL_TABLET | Freq: Every day | ORAL | 0 refills | Status: DC

## 2018-03-13 NOTE — Patient Instructions (Addendum)
Tanya Hill,   Thank you for coming in to clinic today.  1. Take estrace 0.75 mg once daily for 30 days. You have 1 mg tablets.  Take 1/2 tablet plus 1/2 tablet of your new prescription (0.5 mg tablet) until you are finished with your current supply.  Then, take 0.5 mg once daily for 30 days and followup in clinic.  2. You will be due for FASTING BLOOD WORK.  This means you should eat no food or drink after midnight.  Drink only water or coffee without cream/sugar on the morning of your lab visit. - Please go ahead and schedule a "Lab Only" visit in the morning at the clinic for lab draw in the next 7 days. - Your results will be available about 2-3 days after blood draw.  If you have set up a MyChart account, you can can log in to MyChart online to view your results and a brief explanation. Also, we can discuss your results together at your next office visit if you would like.   Please schedule a follow-up appointment with Wilhelmina Mcardle, AGNP. Return in about 2 months (around 05/13/2018) for hormone therapy.  If you have any other questions or concerns, please feel free to call the clinic or send a message through MyChart. You may also schedule an earlier appointment if necessary.  You will receive a survey after today's visit either digitally by e-mail or paper by [employer]. Your experiences and feedback matter to [place].  Please respond so we know how we are doing as we provide care for you.   Wilhelmina Mcardle, DNP, AGNP-BC Adult Gerontology Nurse Practitioner [facility], [facility]

## 2018-03-13 NOTE — Progress Notes (Signed)
Subjective:    Patient ID: Tanya Hill, female    DOB: Mar 13, 1966, 52 y.o.   MRN: [id number]  Tanya Hill is a 52 y.o. female presenting on 03/13/2018 for Annual Exam   HPI Annual Physical Exam Patient has been feeling well.  They have concerns today about continuing medications. Sleeps 6-8 hours per night/day interrupted. 9-4:30  Usually able to go back to sleep easily, but occasionally has panic attack in sleep.  Blood Pressure/Palpitations: Requests to stop metoprolol since she has started taking 1/2 tab bid and continues to have low BP.  She reports initially starting this medication to treat palpitations and high blood pressure during stressful life events with  increased anxiety.  Hormone replacement therapy: Pt has been continued on estrace PO for maintenance of estrogen after hysterectomy/oophrectomy (single) and secondary surgery with oophrectomy after ovarian cyst which would have caused pt to enter early menopause.  Pt requests advice today about continuing this medication.  She has no family history of osteoporosis.  Does have significant family history of heart disease.  HEALTH MAINTENANCE: Weight/BMI: stable - overweight Physical activity: no Diet: 5 meals out per week,  Seatbelt: always Sunscreen: usually PAP: not due  Mammogram: 12/2016 DEXA: Baseline due next year after taper down on estrace to allow pt to enter post-menopause. Colon Cancer Screen: colonoscopy HIV/HEP C: desires today Optometry: regular Dentistry: regular  VACCINES: Tetanus: 2012 Influenza: yearly  Past Medical History:  Diagnosis Date  . Endometriosis   . 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 Hypertension 2017   Past Surgical History:  Procedure Laterality Date  . ABDOMINAL HYSTERECTOMY  2008  . CESAREAN SECTION  1990  . LAPAROSCOPIC SALPINGO OOPHERECTOMY Right 02/25/2017   Procedure: LAPAROSCOPIC RIGHT SALPINGO OOPHORECTOMY;  Surgeon: Linzie Collin, MD;  Location:  [facility] ORS;  Service: Gynecology;  Laterality: Right;  . LAPAROTOMY N/A 02/25/2017   Procedure: LAPAROTOMY;  Surgeon: Linzie Collin, MD;  Location: [facility] ORS;  Service: Gynecology;  Laterality: N/A;  . OOPHORECTOMY Left    Social History   Socioeconomic History  . Marital status: Divorced    Spouse name: Not on file  . Number of children: Not on file  . Years of education: Not on file  . Highest education level: Not on file  Occupational History  . Not on file  Social Needs  . Financial resource strain: Not on file  . Food insecurity:    Worry: Not on file    Inability: Not on file  . Transportation needs:    Medical: Not on file    Non-medical: Not on file  Tobacco Use  . Smoking status: Never Smoker  . Smokeless tobacco: Never Used  Substance and Sexual Activity  . Alcohol use: No  . Drug use: No  . Sexual activity: Not Currently    Birth control/protection: None  Lifestyle  . Physical activity:    Days per week: Not on file    Minutes per session: Not on file  . Stress: Not on file  Relationships  . Social connections:    Talks on phone: Not on file    Gets together: Not on file    Attends religious service: Not on file    Active member of club or organization: Not on file    Attends meetings of clubs or organizations: Not on file    Relationship status: Not on file  . Intimate partner violence:    Fear of current or ex partner:  Not on file    Emotionally abused: Not on file    Physically abused: Not on file    Forced sexual activity: Not on file  Other Topics Concern  . Not on file  Social History Narrative  . Not on file   Family History  Problem Relation Age of Onset  . Hypertension Mother   . Stroke Mother   . Cancer Father        Kidney, prostate  . Hyperlipidemia Father   . Healthy Brother    Current Outpatient Medications on File Prior to Visit  Medication Sig  . Cholecalciferol (VITAMIN D) 2000 units tablet Take 2,000 Units by mouth daily.    Marland Kitchen estradiol (ESTRACE) 1 MG tablet   . loratadine (CLARITIN) 10 MG tablet Take 10 mg by mouth daily.  . metoprolol succinate (TOPROL-XL) 25 MG 24 hr tablet Take 0.5 tablets (12.5 mg total) by mouth daily.  . Multiple Vitamins-Minerals (MULTIVITAMIN WITH MINERALS) tablet Take 1 tablet by mouth daily.  Marland Kitchen estradiol (ESTRACE) 1 MG tablet Take 1 tablet (1 mg total) by mouth daily.   No current facility-administered medications on file prior to visit.     Review of Systems  Constitutional: Negative for chills and fever.  HENT: Negative for congestion and sore throat.   Eyes: Negative for pain.  Respiratory: Negative for cough, shortness of breath and wheezing.   Cardiovascular: Negative for chest pain, palpitations and leg swelling.  Gastrointestinal: Negative for abdominal pain, blood in stool, constipation, diarrhea, nausea and vomiting.  Endocrine: Negative for polydipsia.  Genitourinary: Negative for dysuria, frequency, hematuria and urgency.       Short, intermittent hot flashes  Musculoskeletal: Negative for back pain, myalgias and neck pain.  Skin: Negative.  Negative for rash.  Allergic/Immunologic: Negative for environmental allergies.  Neurological: Negative for dizziness, weakness and headaches.  Hematological: Does not bruise/bleed easily.  Psychiatric/Behavioral: Positive for sleep disturbance (intermittent, occasional c/w PTSD). Negative for dysphoric mood and suicidal ideas. The patient is not nervous/anxious.    Per HPI unless specifically indicated above     Objective:    BP 101/62 (BP Location: Right Arm, Patient Position: Sitting, Cuff Size: Normal)   Pulse 74   Temp 98.2 F (36.8 C) (Oral)   Ht  (1.626 m)   Wt 159 lb 12.8 oz (72.5 kg)   BMI 27.43 kg/m   Wt Readings from Last 3 Encounters:  03/13/18 159 lb 12.8 oz (72.5 kg)  07/30/17 160 lb 2 oz (72.6 kg)  04/16/17 157 lb 7 oz (71.4 kg)    Physical Exam  Constitutional: She is oriented to person, place,  and time. She appears well-developed and well-nourished. No distress.  HENT:  Head: Normocephalic and atraumatic.  Right Ear: External ear normal.  Left Ear: External ear normal.  Nose: Nose normal.  Mouth/Throat: Oropharynx is clear and moist.  Eyes: Pupils are equal, round, and reactive to light. Conjunctivae are normal.  Neck: Normal range of motion. Neck supple. No JVD present. No tracheal deviation present. No thyromegaly present.  Cardiovascular: Normal rate, regular rhythm, normal heart sounds and intact distal pulses. Exam reveals no gallop and no friction rub.  No murmur heard. Pulmonary/Chest: Effort normal and breath sounds normal. No respiratory distress.  Breast - Normal exam w/ symmetric breasts, no mass, no nipple discharge, no skin changes or tenderness.  Abdominal: Soft. Bowel sounds are normal. She exhibits no distension. There is no tenderness.  Musculoskeletal: Normal range of motion.  Lymphadenopathy:  She has no cervical adenopathy.  Neurological: She is alert and oriented to person, place, and time. No cranial nerve deficit.  Skin: Skin is warm and dry. Capillary refill takes less than 2 seconds.  Psychiatric: She has a normal mood and affect. Her behavior is normal. Judgment and thought content normal.  Nursing note and vitals reviewed.   Results for orders placed or performed in visit on 04/01/17  TSH  Result Value Ref Range   TSH 0.84 mIU/L      Assessment & Plan:   Problem List Items Addressed This Visit      Cardiovascular and Mediastinum   Hypertension     Other   On postmenopausal hormone replacement therapy    Other Visit Diagnoses    Annual physical exam    -  Primary   Relevant Orders   HIV antibody   Hepatitis C antibody   Hemoglobin A1c   Lipid panel   TSH   CBC with Differential/Platelet   COMPLETE METABOLIC PANEL WITH GFR   Colon cancer screening       Relevant Orders   Ambulatory referral to Gastroenterology   Estrogen  deficiency       Relevant Medications   estradiol (ESTRACE) 0.5 MG tablet     #1 Physical Physical exam with no new findings.  Well adult with no acute concerns.  Plan: 1. Obtain health maintenance screenings as above according to age. - Increase physical activity to 30 minutes most days of the week.  - Eat healthy diet high in vegetables and fruits; low in refined carbohydrates. 2. Return 1 year for annual physical.   #2 Hypertension: Controlled on exam.  Pt with mild hypotension which precludes continuation of treatment.  Stop metoprolol.  Instructed pt to monitor for s/sx of racing heart rate and palpitations.  Likely were initially related to anxiety and stress and may not need continuous use of beta blocker.  If resumes, will choose atenolol/propranolol for beta blocker therapy with less BP reduction. Followup as needed.  #3 Hormone Replacement therapy: Stable on current dose of estradiol 1 mg daily.  Patient maintained on this dose secondary to complete oophorectomy in the absence of estrogen.  Patient reports her mother entered menopause around age 17.  Discussion of continuation of hormone replacement therapy centers around risks versus benefits.  Risks and benefits discussed at length with patient today to include increased cardiovascular risk.  Benefits to include estrogen maintenance for bone health.  After lengthy discussion, patient desires to start with reducing estradiol for discontinuation. - For next 4 weeks, take 0.75 mg daily.  Start estradiol 0.5 mg tablet.  Take 1.5 tablets once daily. - Then, take estradiol 0.5 mg tablet 1 tablet daily. - Follow-up 2 months.  Meds ordered this encounter  Medications  . estradiol (ESTRACE) 0.5 MG tablet    Sig: Take 1.5 tablets (0.75 mg total) by mouth daily.    Dispense:  45 tablet    Refill:  0    Order Specific Question:   Supervising Provider    Answer:   Smitty Cords [[id number]]     Follow up plan: Return in about 2  months (around 05/13/2018) for hormone therapy.  Wilhelmina Mcardle, DNP, AGPCNP-BC Adult Gerontology Primary Care Nurse Practitioner [facility] 03/13/2018, 2:33 PM

## 2018-03-14 ENCOUNTER — Encounter: Payer: Self-pay | Admitting: Nurse Practitioner

## 2018-03-14 DIAGNOSIS — Z79890 Hormone replacement therapy (postmenopausal): Secondary | ICD-10-CM | POA: Insufficient documentation

## 2018-03-18 ENCOUNTER — Other Ambulatory Visit: Payer: BLUE CROSS/BLUE SHIELD

## 2018-03-21 LAB — CBC WITH DIFFERENTIAL/PLATELET
Basophils Absolute: 31 cells/uL (ref 0–200)
Basophils Relative: 0.9 %
Eosinophils Absolute: 177 cells/uL (ref 15–500)
Eosinophils Relative: 5.2 %
HCT: 39.3 % (ref 35.0–45.0)
Hemoglobin: 13.4 g/dL (ref 11.7–15.5)
Lymphs Abs: 1238 cells/uL (ref 850–3900)
MCH: 30.9 pg (ref 27.0–33.0)
MCHC: 34.1 g/dL (ref 32.0–36.0)
MCV: 90.6 fL (ref 80.0–100.0)
MPV: 10.6 fL (ref 7.5–12.5)
Monocytes Relative: 9.6 %
Neutro Abs: 1629 cells/uL (ref 1500–7800)
Neutrophils Relative %: 47.9 %
Platelets: 219 10*3/uL (ref 140–400)
RBC: 4.34 10*6/uL (ref 3.80–5.10)
RDW: 12.8 % (ref 11.0–15.0)
Total Lymphocyte: 36.4 %
WBC mixed population: 326 cells/uL (ref 200–950)
WBC: 3.4 10*3/uL — ABNORMAL LOW (ref 3.8–10.8)

## 2018-03-21 LAB — COMPLETE METABOLIC PANEL WITH GFR
AG Ratio: 1.8 (calc) (ref 1.0–2.5)
ALT: 20 U/L (ref 6–29)
AST: 20 U/L (ref 10–35)
Albumin: 4.2 g/dL (ref 3.6–5.1)
Alkaline phosphatase (APISO): 77 U/L (ref 33–115)
BUN: 13 mg/dL (ref 7–25)
CO2: 29 mmol/L (ref 20–32)
Calcium: 9.4 mg/dL (ref 8.6–10.2)
Chloride: 105 mmol/L (ref 98–110)
Creat: 0.9 mg/dL (ref 0.50–1.10)
GFR, Est African American: 87 mL/min/{1.73_m2} (ref >=60)
GFR, Est Non African American: 75 mL/min/{1.73_m2} (ref >=60)
Globulin: 2.4 g/dL (calc) (ref 1.9–3.7)
Glucose, Bld: 86 mg/dL (ref 65–99)
Potassium: 4.3 mmol/L (ref 3.5–5.3)
Sodium: 141 mmol/L (ref 135–146)
Total Bilirubin: 0.4 mg/dL (ref 0.2–1.2)
Total Protein: 6.6 g/dL (ref 6.1–8.1)

## 2018-03-21 LAB — LIPID PANEL
Cholesterol: 210 mg/dL — ABNORMAL HIGH (ref <200)
HDL: 57 mg/dL (ref >50)
LDL Cholesterol (Calc): 136 mg/dL (calc) — ABNORMAL HIGH
Non-HDL Cholesterol (Calc): 153 mg/dL (calc) — ABNORMAL HIGH (ref <130)
Total CHOL/HDL Ratio: 3.7 (calc) (ref <5.0)
Triglycerides: 71 mg/dL (ref <150)

## 2018-03-21 LAB — HEMOGLOBIN A1C
Hgb A1c MFr Bld: 5.4 % of total Hgb (ref <5.7)
Mean Plasma Glucose: 108 (calc)
eAG (mmol/L): 6 (calc)

## 2018-03-21 LAB — HIV ANTIBODY (ROUTINE TESTING W REFLEX): HIV 1&2 Ab, 4th Generation: NONREACTIVE

## 2018-03-21 LAB — HEPATITIS C ANTIBODY
Hepatitis C Ab: NONREACTIVE
SIGNAL TO CUT-OFF: 0.02 (ref <1.00)

## 2018-03-21 LAB — TSH: TSH: 1.13 mIU/L

## 2018-03-26 ENCOUNTER — Other Ambulatory Visit: Payer: Self-pay

## 2018-03-26 ENCOUNTER — Emergency Department: Payer: BLUE CROSS/BLUE SHIELD

## 2018-03-26 ENCOUNTER — Emergency Department
Admission: EM | Admit: 2018-03-26 | Discharge: 2018-03-26 | Disposition: A | Discharge status: Home / Self Care | Payer: BLUE CROSS/BLUE SHIELD | Attending: Emergency Medicine | Admitting: Emergency Medicine

## 2018-03-26 DIAGNOSIS — Z79899 Other long term (current) drug therapy: Secondary | ICD-10-CM | POA: Insufficient documentation

## 2018-03-26 DIAGNOSIS — F419 Anxiety disorder, unspecified: Secondary | ICD-10-CM | POA: Insufficient documentation

## 2018-03-26 DIAGNOSIS — Z91040 Latex allergy status: Secondary | ICD-10-CM | POA: Diagnosis not present

## 2018-03-26 DIAGNOSIS — R002 Palpitations: Secondary | ICD-10-CM | POA: Diagnosis not present

## 2018-03-26 DIAGNOSIS — I10 Essential (primary) hypertension: Secondary | ICD-10-CM | POA: Insufficient documentation

## 2018-03-26 DIAGNOSIS — R232 Flushing: Secondary | ICD-10-CM | POA: Diagnosis not present

## 2018-03-26 LAB — CBC
HCT: 37.5 % (ref 35.0–47.0)
Hemoglobin: 13.2 g/dL (ref 12.0–16.0)
MCH: 31.4 pg (ref 26.0–34.0)
MCHC: 35.1 g/dL (ref 32.0–36.0)
MCV: 89.5 fL (ref 80.0–100.0)
PLATELETS: 255 10*3/uL (ref 150–440)
RBC: 4.19 MIL/uL (ref 3.80–5.20)
RDW: 13.4 % (ref 11.5–14.5)
WBC: 5.5 10*3/uL (ref 3.6–11.0)

## 2018-03-26 LAB — COMPREHENSIVE METABOLIC PANEL
ALT: 26 U/L (ref 14–54)
AST: 24 U/L (ref 15–41)
Albumin: 4.4 g/dL (ref 3.5–5.0)
Alkaline Phosphatase: 72 U/L (ref 38–126)
Anion gap: 8 (ref 5–15)
BILIRUBIN TOTAL: 0.5 mg/dL (ref 0.3–1.2)
BUN: 20 mg/dL (ref 6–20)
CALCIUM: 9.5 mg/dL (ref 8.9–10.3)
CHLORIDE: 103 mmol/L (ref 101–111)
CO2: 28 mmol/L (ref 22–32)
CREATININE: 0.83 mg/dL (ref 0.44–1.00)
Glucose, Bld: 98 mg/dL (ref 65–99)
Potassium: 3.5 mmol/L (ref 3.5–5.1)
Sodium: 139 mmol/L (ref 135–145)
TOTAL PROTEIN: 7.6 g/dL (ref 6.5–8.1)

## 2018-03-26 LAB — TROPONIN I

## 2018-03-26 LAB — T4, FREE: Free T4: 0.89 ng/dL (ref 0.82–1.77)

## 2018-03-26 LAB — TSH: TSH: 2.385 u[IU]/mL (ref 0.350–4.500)

## 2018-03-26 MED ORDER — ATENOLOL 25 MG PO TABS
12.5000 mg | ORAL_TABLET | Freq: Once | ORAL | Status: AC
  Administered 2018-03-26: 12.5 mg via ORAL
  Filled 2018-03-26: qty 1

## 2018-03-26 NOTE — Discharge Instructions (Addendum)
You may take 1/2 tablet Atenolol if you experience recurrent symptoms tonight.  Please call your doctor in the morning.  Return to the ER for worsening symptoms, persistent vomiting, difficulty breathing or other concerns.

## 2018-03-26 NOTE — ED Provider Notes (Signed)
[facility] Emergency Department Provider Note   ____________________________________________   First MD Initiated Contact with Patient 03/26/18 0142     (approximate)  I have reviewed the triage vital signs and the nursing notes.   HISTORY  Chief Complaint Chest Pain    HPI Tanya Hill is a 52 y.o. female who presents to the ED from home with a chief complaint of facial flushing and heart fluttering.  Patient has a history of anxiety/PTSD as well as hypertension.  Had been taking metoprolol 12.5 mg extended release.  Saw her PCP on 03/13/2018 and they decided to take her off secondary to marginally low blood pressures.  Her estrogen was also decreased at that time.  Tonight patient was trying to go to bed and felt like her face was hot.  Had a brief episode of heart fluttering without chest pain or shortness of breath.  Denies associated diaphoresis, nausea/vomiting, dizziness.  She think those symptoms then send her into a panic.  Checked her blood pressure which was 160 systolic which was high for her.  States she drank a Pepsi during the day which she never drink sodas.  Denies recent fever, chills, cough, congestion, abdominal pain, dysuria, diarrhea.  Denies recent travel or trauma.   Past Medical History:  Diagnosis Date  . Endometriosis   . 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 Hypertension 2017    Patient Active Problem List   Diagnosis Date Noted  . On postmenopausal hormone replacement therapy 03/14/2018  . Goiter, nodular 04/01/2017  . Anxiety 08/20/2016  . Palpitation 04/05/2016  . Hypertension 10/16/2015    Past Surgical History:  Procedure Laterality Date  . ABDOMINAL HYSTERECTOMY  2008  . CESAREAN SECTION  1990  . LAPAROSCOPIC SALPINGO OOPHERECTOMY Right 02/25/2017   Procedure: LAPAROSCOPIC RIGHT SALPINGO OOPHORECTOMY;  Surgeon: Linzie Collin, MD;  Location: [facility] ORS;  Service: Gynecology;  Laterality: Right;  .  LAPAROTOMY N/A 02/25/2017   Procedure: LAPAROTOMY;  Surgeon: Linzie Collin, MD;  Location: [facility] ORS;  Service: Gynecology;  Laterality: N/A;  . OOPHORECTOMY Left     Prior to Admission medications   Medication Sig Start Date End Date Taking? Authorizing Provider  Cholecalciferol (VITAMIN D) 2000 units tablet Take 2,000 Units by mouth daily.    [provider]  estradiol (ESTRACE) 0.5 MG tablet Take 1.5 tablets (0.75 mg total) by mouth daily. 03/13/18 04/12/18  Galen Manila, NP  estradiol (ESTRACE) 1 MG tablet Take 1 tablet (1 mg total) by mouth daily. 07/30/17 10/28/17  Linzie Collin, MD  loratadine (CLARITIN) 10 MG tablet Take 10 mg by mouth daily.    [provider]  metoprolol succinate (TOPROL-XL) 25 MG 24 hr tablet Take 0.5 tablets (12.5 mg total) by mouth daily. 04/01/17   Galen Manila, NP  Multiple Vitamins-Minerals (MULTIVITAMIN WITH MINERALS) tablet Take 1 tablet by mouth daily.    [provider]    Allergies Latex and Penicillins  Family History  Problem Relation Age of Onset  . Hypertension Mother   . Stroke Mother   . Cancer Father        Kidney, prostate  . Hyperlipidemia Father   . Healthy Brother     Social History Social History   Tobacco Use  . Smoking status: Never Smoker  . Smokeless tobacco: Never Used  Substance Use Topics  . Alcohol use: No  . Drug use: No    Review of Systems  Constitutional: No  fever/chills Eyes: No visual changes. ENT: Positive for facial flushing.  No sore throat. Cardiovascular: Positive for palpitations.  Denies chest pain. Respiratory: Denies shortness of breath. Gastrointestinal: No abdominal pain.  No nausea, no vomiting.  No diarrhea.  No constipation. Genitourinary: Negative for dysuria. Musculoskeletal: Negative for back pain. Skin: Negative for rash. Neurological: Negative for headaches, focal weakness or  numbness.   ____________________________________________   PHYSICAL EXAM:  VITAL SIGNS: ED Triage Vitals  Enc Vitals Group     BP 03/26/18 0054 (!) 145/80     Pulse Rate 03/26/18 0054 81     Resp 03/26/18 0054 20     Temp 03/26/18 0054 98 F (36.7 C)     Temp Source 03/26/18 0054 Oral     SpO2 03/26/18 0054 100 %     Weight 03/26/18 0055 160 lb (72.6 kg)     Height 03/26/18 0055 5\' 4"  (1.626 m)     Head Circumference --      Peak Flow --      Pain Score 03/26/18 0054 2     Pain Loc --      Pain Edu? --      Excl. in GC? --     Constitutional: Alert and oriented. Well appearing and in no acute distress.  Slightly anxious. Eyes: Conjunctivae are normal. PERRL. EOMI. Head: Atraumatic. Nose: No congestion/rhinnorhea. Mouth/Throat: Mucous membranes are moist.  Oropharynx non-erythematous. Neck: No stridor.   Cardiovascular: Normal rate, regular rhythm. Grossly normal heart sounds.  Good peripheral circulation. Respiratory: Normal respiratory effort.  No retractions. Lungs CTAB. Gastrointestinal: Soft and nontender. No distention. No abdominal bruits. No CVA tenderness. Musculoskeletal: No lower extremity tenderness nor edema.  No joint effusions. Neurologic:  Normal speech and language. No gross focal neurologic deficits are appreciated. No gait instability. Skin:  Skin is warm, dry and intact. No rash noted. Psychiatric: Mood and affect are normal. Speech and behavior are normal.  ____________________________________________   LABS (all labs ordered are listed, but only abnormal results are displayed)  Labs Reviewed  CBC  COMPREHENSIVE METABOLIC PANEL  TROPONIN I  TSH  T4, FREE   ____________________________________________  EKG  ED ECG REPORT I, SUNG,JADE J, the attending physician, personally viewed and interpreted this ECG.   Date: 03/26/2018  EKG Time: 0059  Rate: 73  Rhythm: normal EKG, normal sinus rhythm  Axis: Normal  Intervals:none  ST&T Change:  Nonspecific  ____________________________________________  RADIOLOGY  ED MD interpretation: No acute cardiopulmonary process  Official radiology report(s): Dg Chest 2 View  Result Date: 03/26/2018 CLINICAL DATA:  Palpitations. EXAM: CHEST - 2 VIEW COMPARISON:  04/01/2017 FINDINGS: The cardiomediastinal contours are normal. Stable biapical pleuroparenchymal scarring. Pulmonary vasculature is normal. No consolidation, pleural effusion, or pneumothorax. No acute osseous abnormalities are seen. IMPRESSION: No acute pulmonary process. Electronically Signed   By: Rubye Oaks M.D.   On: 03/26/2018 02:30    ____________________________________________   PROCEDURES  Procedure(s) performed: None  Procedures  Critical Care performed: No  ____________________________________________   INITIAL IMPRESSION / ASSESSMENT AND PLAN / ED COURSE  As part of my medical decision making, I reviewed the following data within the electronic MEDICAL RECORD NUMBER History obtained from family, Nursing notes reviewed and incorporated, Labs reviewed, EKG interpreted, Old chart reviewed, Radiograph reviewed and Notes from prior ED visits   52 year old female with hypertension, goiter, PTSD who presents with facial flushing and brief episode of palpitations without chest pain. Differential diagnosis includes, but is not limited to, ACS, aortic  dissection, pulmonary embolism, cardiac tamponade, pneumothorax, pneumonia, pericarditis, myocarditis, GI-related causes including esophagitis/gastritis, and musculoskeletal chest wall pain.    Patient denies all symptoms at the time of our exam and interview.  I personally reviewed patient's clinic visit from 03/13/2018 with her PCP who states if palpitations continue then she would consider atenolol/propranolol for beta-blocker therapy with less BP reduction.  Updated patient and her partner of all test results.  Will send patient home with an atenolol tablet 25 mg with  instructions to take half if her symptoms recur overnight.  She is to call her PCP in the morning for further instructions.  Strict return precautions given.  Both verbalize understanding and agree with plan of care.      ____________________________________________   FINAL CLINICAL IMPRESSION(S) / ED DIAGNOSES  Final diagnoses:  Palpitations  Facial flushing     ED Discharge Orders    None       Note:  This document was prepared using Dragon voice recognition software and may include unintentional dictation errors.    Irean HongSung, Jade J, MD 03/26/18 [phone]

## 2018-03-26 NOTE — ED Triage Notes (Signed)
Pt in with co heart fluttering while sleeping, has been on metoprolol in the past for the same but is no longer on it.

## 2018-04-04 ENCOUNTER — Other Ambulatory Visit: Payer: Self-pay

## 2018-04-04 DIAGNOSIS — E2839 Other primary ovarian failure: Secondary | ICD-10-CM

## 2018-04-04 MED ORDER — ESTRADIOL 0.5 MG PO TABS: 0.7500 mg | ORAL_TABLET | Freq: Every day | ORAL | 0 refills | Status: DC

## 2018-04-04 NOTE — Telephone Encounter (Signed)
The pt called w/ concerns about her refill on her estradiol. She currently have about a weeks worth of medication, but no refills. She was told she needed to f/u in 30 days of taking the medication, but her appointment was scheduled for 05/08/18. Please advise

## 2018-04-09 ENCOUNTER — Other Ambulatory Visit: Payer: Self-pay | Admitting: Nurse Practitioner

## 2018-04-09 DIAGNOSIS — E2839 Other primary ovarian failure: Secondary | ICD-10-CM

## 2018-04-09 MED ORDER — ESTRADIOL 0.5 MG PO TABS: 0.5000 mg | ORAL_TABLET | Freq: Every day | ORAL | 0 refills | Status: DC

## 2018-04-09 NOTE — Progress Notes (Signed)
Pt called again for clarification.  Pt is weaning off hormone replacement.  Is finishing 30-days of 0.75 mg daily estradiol today.  Wants to know how to proceed.  Verified that a prescription has been sent for next 30 days.  Pt should take 1 tablet (0.5 mg) once daily until that appointment.  Pt verbalizes understanding.

## 2018-04-15 ENCOUNTER — Other Ambulatory Visit: Payer: Self-pay | Admitting: Nurse Practitioner

## 2018-04-15 DIAGNOSIS — E2839 Other primary ovarian failure: Secondary | ICD-10-CM

## 2018-04-15 MED ORDER — ESTRADIOL 0.5 MG PO TABS: 0.5000 mg | ORAL_TABLET | Freq: Every day | ORAL | 0 refills | Status: DC

## 2018-05-08 ENCOUNTER — Other Ambulatory Visit: Payer: Self-pay

## 2018-05-08 ENCOUNTER — Encounter: Payer: Self-pay | Admitting: Nurse Practitioner

## 2018-05-08 ENCOUNTER — Ambulatory Visit: Payer: BLUE CROSS/BLUE SHIELD | Admitting: Nurse Practitioner

## 2018-05-08 VITALS — BP 106/60 | HR 62 | Temp 97.9°F | Ht 64.0 in | Wt 159.2 lb

## 2018-05-08 DIAGNOSIS — Z79890 Hormone replacement therapy (postmenopausal): Secondary | ICD-10-CM | POA: Diagnosis not present

## 2018-05-08 DIAGNOSIS — Z7189 Other specified counseling: Secondary | ICD-10-CM

## 2018-05-08 DIAGNOSIS — Z90710 Acquired absence of both cervix and uterus: Secondary | ICD-10-CM

## 2018-05-08 DIAGNOSIS — Z7184 Encounter for health counseling related to travel: Secondary | ICD-10-CM

## 2018-05-08 DIAGNOSIS — Z23 Encounter for immunization: Secondary | ICD-10-CM

## 2018-05-08 MED ORDER — ESTRADIOL 0.5 MG PO TABS
ORAL_TABLET | ORAL | 0 refills | Status: DC
Start: 2018-05-08 — End: 2018-07-07

## 2018-05-08 MED ORDER — ALBENDAZOLE 200 MG PO TABS: 400.0000 mg | ORAL_TABLET | Freq: Once | ORAL | 0 refills | Status: AC

## 2018-05-08 MED ORDER — CIPROFLOXACIN HCL 500 MG PO TABS: 500.0000 mg | ORAL_TABLET | Freq: Two times a day (BID) | ORAL | 0 refills | Status: DC

## 2018-05-08 MED ORDER — HEPATITIS A VACCINE 1440 EL U/ML IM SUSP: 1.0000 mL | Freq: Once | INTRAMUSCULAR | Status: DC

## 2018-05-08 NOTE — Patient Instructions (Addendum)
Tanya GraysVickie M Hill,   Thank you for coming in to clinic today.  1. For your [place] trip: - Take cipro 500 mg twice daily for 3 days if experience traveler's diarrhea - Hepatitis A vaccine today.  Repeat in 6 months, then covered for life. - Protect yourself with DEET mosquito spray.  Watch for signs of Malaria.  - Eat trusted foods and water sources.  Know your symptoms of Typhoid.  2. For your hormones: - Take 0.5 mg tablet through 8/1 - Then take 1/2 tablet (0.25 mg) daily for 14 days.  Then every other day for 14 days and stop.   Please schedule a follow-up appointment with Tanya Hill, AGNP. Return in about 10 months (around 03/14/2019) for annual physical.  If you have any other questions or concerns, please feel free to call the clinic or send a message through MyChart. You may also schedule an earlier appointment if necessary.  You will receive a survey after today's visit either digitally by e-mail or paper by [employer] mail. Your experiences and feedback matter to us.  Please respond so we know how we are doing as we provide care for you.   Tanya McardleLauren Myrle Dues, DNP, AGNP-BC Adult Gerontology Nurse Practitioner [facility], [facility]   Typhoid Fever Typhoid fever is an illness that is caused by the bacteria Salmonella typhi, or S. typhi. Typhoid fever can be a life-threatening condition. What are the causes? In most cases, typhoid infection happens after a person drinks water or eats food that is contaminated with S. typhi bacteria. Typhoid fever can be a problem in areas that have poor sanitation. In countries that have large outbreaks, the problem usually results when street vendor food or water supplies become contaminated with the stool (feces) of an infected person. In some cases, an infected person can continue to spread typhoid bacteria even after he or she no longer has symptoms of the illness. A small percentage of people who have recovered from typhoid fever  become chronic carriers. A chronic carrier is a person who does not have, or who no longer has, symptoms of typhoid fever but continues to excrete typhoid bacteria for more than one year. A chronic carrier may not appear to be ill, but he or she can still spread the bacteria to other people. What are the signs or symptoms? Symptoms of typhoid fever may include:  High fever. At first, the fever comes and goes. It becomes constant after a few days of illness.  Headache.  Muscle aches and weakness.  Extreme tiredness (fatigue).  Stomach cramps with diarrhea or constipation.  Nosebleed.  Nausea.  Vomiting.  Cough.  Rash.  Confusion or delirium.  Swollen abdomen.  Enlarged liver or spleen.  How is this diagnosed? Your health care provider will do a physical exam and ask you about your travel history and your symptoms. This condition is diagnosed by examining a culture of your stool, urine, blood, or other body tissue under a microscope to check for typhoid bacteria. How is this treated? This condition is treated with antibiotic medicines that may be taken by mouth or may be given through an IV tube. Follow these instructions at home:  Take over-the-counter and prescription medicines only as told by your health care provider.  If you were prescribed an antibiotic medicine, finish all of it even if you start to feel better.  Thoroughly wash your hands with antibacterial soap and warm water after: ? You use the toilet. ? You help a  child to use the toilet. ? You change a diaper.  Use a bleach-based household cleaner to disinfect any surfaces of your home or living area that might be contaminated.  Do not prepare food for other people until both of these things have happened: ? You have been treated with antibiotics. ? You have been free of all symptoms for at least one week.  Avoid close contact with other people until you are well.  Follow instructions from your health  care provider about how to avoid spreading infection to others. How is this prevented?  Before you travel to a country where typhoid fever is a problem, talk with your health care provider or a travel clinic about getting a vaccination against typhoid. Ask the provider when the vaccination needs to be completed so that there will be enough time for the vaccine to take effect before you begin traveling.  When you travel in an area where typhoid is common, avoid unsafe foods and drinks: ? Only drink sealed bottled drinks and sealed bottled water. ? Do not use ice in drinks unless the ice was made from bottled or boiled water. ? Do not eat raw fruits or vegetables unless they have a peel and you have peeled them yourself. ? Do not eat raw, rare, or undercooked meat, fish, or eggs. ? When possible, cook your own food. ? Only eat foods that have been cooked thoroughly and kept hot until served. ? Do not eat foods or drink beverages from street vendors.  Avoid contact with sick people. Contact a health care provider if:  Your symptoms seem to be getting worse.  You develop new symptoms.  You have a fever.  Your fever gets worse, or it comes back after it had gone away. Get help right away if:  You develop severe vomiting or diarrhea.  You cannot eat or drink without vomiting. This information is not intended to replace advice given to you by your health care provider. Make sure you discuss any questions you have with your health care provider. Document Released: 12/22/2002 Document Revised: 03/13/2016 Document Reviewed: 02/18/2015 Elsevier Interactive Patient Education  2018 [employer].

## 2018-05-08 NOTE — Progress Notes (Signed)
Subjective:    Patient ID: Tanya Hill, female    DOB: August 29, 1966, 52 y.o.   MRN: [id number]  Tanya Hill is a 52 y.o. female presenting on 05/08/2018 for Menopause   HPI Menopause on HRT Patient with history of total hysterectomy presents today for further titration of HRT to stop and induce natural menopause.  She is currently taking 0.5 mg daily through 05/15/2018.  She is having no increase in hot flashes, moodiness, or other post menopausal symptoms.  Palpitations She has had metoprolol in past for BP and palpitations.  She stopped this medication after last visit, then had a panic attack for which she sought care in ED.  She has had no recurrent palpitations since that time.  Travel Consultation Patient will be traveling to [place] on 05/16/2018 for medical mission trip.  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  She goes nearly annually and usually takes preventative parasite treatment upon return.  She and the team also usually experience traveler's diarrhea near the end of the trip or on the first day back home.  She usually does not take antimalarial medications, but does use DEET mosquito spray.  She does not recall if she has ever had Hep A or Typhoid vaccinations, but believes she most likely has not.    Social History   Tobacco Use  . Smoking status: Never Smoker  . Smokeless tobacco: Never Used  Substance Use Topics  . Alcohol use: No  . Drug use: No    Review of Systems Per HPI unless specifically indicated above     Objective:    BP 106/60 (BP Location: Right Arm, Patient Position: Sitting, Cuff Size: Normal)   Pulse 62   Temp 97.9 F (36.6 C) (Oral)   Ht 5\' 4"  (1.626 m)   Wt 159 lb 3.2 oz (72.2 kg)   BMI 27.33 kg/m   Wt Readings from Last 3 Encounters:  05/08/18 159 lb 3.2 oz (72.2 kg)  03/26/18 160 lb (72.6 kg)  03/13/18 159 lb 12.8 oz (72.5 kg)    Physical Exam  Constitutional: She is oriented to  person, place, and time. She appears well-developed and well-nourished. No distress.  HENT:  Head: Normocephalic and atraumatic.  Cardiovascular: Normal rate, regular rhythm, S1 normal, S2 normal, normal heart sounds and intact distal pulses.  Pulmonary/Chest: Effort normal and breath sounds normal. No respiratory distress.  Neurological: She is alert and oriented to person, place, and time.  Skin: Skin is warm and dry. Capillary refill takes less than 2 seconds.  Psychiatric: She has a normal mood and affect. Her behavior is normal. Judgment and thought content normal.  Vitals reviewed.    Results for orders placed or performed during the hospital encounter of 03/26/18  CBC  Result Value Ref Range   WBC 5.5 3.6 - 11.0 K/uL   RBC 4.19 3.80 - 5.20 MIL/uL   Hemoglobin 13.2 12.0 - 16.0 g/dL   HCT 40.9 81.1 - 91.4 %   MCV 89.5 80.0 - 100.0 fL   MCH 31.4 26.0 - 34.0 pg   MCHC 35.1 32.0 - 36.0 g/dL   RDW 78.2 95.6 - 21.3 %   Platelets 255 150 - 440 K/uL  Comprehensive metabolic panel  Result Value Ref Range   Sodium 139 135 - 145 mmol/L   Potassium 3.5 3.5 - 5.1 mmol/L   Chloride 103 101 - 111 mmol/L   CO2 28 22 - 32 mmol/L  Glucose, Bld 98 65 - 99 mg/dL   BUN 20 6 - 20 mg/dL   Creatinine, Ser 4.090.83 0.44 - 1.00 mg/dL   Calcium 9.5 8.9 - 81.110.3 mg/dL   Total Protein 7.6 6.5 - 8.1 g/dL   Albumin 4.4 3.5 - 5.0 g/dL   AST 24 15 - 41 U/L   ALT 26 14 - 54 U/L   Alkaline Phosphatase 72 38 - 126 U/L   Total Bilirubin 0.5 0.3 - 1.2 mg/dL   GFR calc non Af Amer >60 >60 mL/min   GFR calc Af Amer >60 >60 mL/min   Anion gap 8 5 - 15  Troponin I  Result Value Ref Range   Troponin I <0.03 <0.03 ng/mL  TSH  Result Value Ref Range   TSH 2.385 0.350 - 4.500 uIU/mL  T4, free  Result Value Ref Range   Free T4 0.89 0.82 - 1.77 ng/dL      Assessment & Plan:   Problem List Items Addressed This Visit      Other   On postmenopausal hormone replacement therapy - Primary   Relevant  Medications   estradiol (ESTRACE) 0.5 MG tablet    Other Visit Diagnoses    S/P complete hysterectomy       Relevant Medications   estradiol (ESTRACE) 0.5 MG tablet   Travel advice encounter       Relevant Medications   albendazole (ALBENZA) 200 MG tablet   ciprofloxacin (CIPRO) 500 MG tablet   Other Relevant Orders   Hepatitis A vaccine adult IM (Completed)    # Stable on HRT taper to date.  No increased postmenopausal symptoms or moodiness that supports staying on HRT.   - Continue taper down.  Finish 0.5 mg once daily through 05/15/2018.  Then, take 0.25 mg once daily for 14 days and 0.25 mg every other day for 14 days.  Then stop. - Plan DEXA at next annual physical. - Followup as needed if experiencing any significant postmenopausal symptoms.  # Travel advice encounter Patient with travel on 05/16/2018 to [place].  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 - Take albendazole 200 mg tablet.  Take 2 tablets once on return to [place]. - For traveler's diarrhea: if symptoms occur, take ciprofloxacin 500 mg bid x 3 days. - Eat trusted food sources, drink trusted water sources. - No prior Hep A vaccination  - administer dose 1 today.  Repeat 2nd dose in 6-12 months (or at annual physical).  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 - Use DEET mosquito repellant.  Discussed antimalarials and through collaborative discussion patient decides benefit does not outweigh risk of side effects. - Typhoid vaccine also recommended.  Patient has not had this in past per her recollection.  Again through collaborative discussion, timing prior to trip does not allow development of immune response to vaccination. Will defer at this time.  Provided information about s/sx of typhoid fever for patient review. Again, encouraged to eat and drink from trusted food and water sources. - Followup as needed upon return from  [place].   Meds ordered this encounter  Medications  . estradiol (ESTRACE) 0.5 MG tablet    Sig: Take 0.5 tablets (0.25 mg total) by mouth daily for 14 days, THEN 0.5 tablets (0.25 mg total) every other day for 14 days.    Dispense:  11 tablet    Refill:  0    Order Specific  Question:   Supervising Provider    Answer:   Smitty Cords [[id number]]  . albendazole (ALBENZA) 200 MG tablet    Sig: Take 2 tablets (400 mg total) by mouth once for 1 dose.    Dispense:  2 tablet    Refill:  0    Order Specific Question:   Supervising Provider    Answer:   Smitty Cords [[id number]]  . ciprofloxacin (CIPRO) 500 MG tablet    Sig: Take 1 tablet (500 mg total) by mouth 2 (two) times daily.    Dispense:  6 tablet    Refill:  0    Order Specific Question:   Supervising Provider    Answer:   Smitty Cords [[id number]]    Follow up plan: Return in about 10 months (around 03/14/2019) for annual physical.  Wilhelmina Mcardle, DNP, AGPCNP-BC Adult Gerontology Primary Care Nurse Practitioner [facility] 05/08/2018, 8:18 AM

## 2018-05-30 ENCOUNTER — Telehealth: Payer: Self-pay | Admitting: Gastroenterology

## 2018-05-30 NOTE — Telephone Encounter (Signed)
ptleft vm to cancel her procedure 8/26 she would like a call back to confirm cancellation

## 2018-06-02 NOTE — Telephone Encounter (Signed)
Patients procedure has been canceled for 06/09/18.  Thanks [employer]

## 2018-06-09 ENCOUNTER — Encounter: Admission: RE | Payer: Self-pay | Source: Ambulatory Visit

## 2018-06-09 ENCOUNTER — Ambulatory Visit
Admission: RE | Admit: 2018-06-09 | Payer: BLUE CROSS/BLUE SHIELD | Source: Ambulatory Visit | Admitting: Gastroenterology

## 2018-06-09 SURGERY — COLONOSCOPY WITH PROPOFOL
Anesthesia: General

## 2018-07-07 ENCOUNTER — Ambulatory Visit (INDEPENDENT_AMBULATORY_CARE_PROVIDER_SITE_OTHER): Payer: BC Managed Care – PPO | Admitting: Nurse Practitioner

## 2018-07-07 ENCOUNTER — Encounter: Payer: Self-pay | Admitting: Nurse Practitioner

## 2018-07-07 ENCOUNTER — Other Ambulatory Visit: Payer: Self-pay

## 2018-07-07 VITALS — BP 129/74 | HR 68 | Temp 98.2°F | Ht 64.0 in | Wt 158.2 lb

## 2018-07-07 DIAGNOSIS — M722 Plantar fascial fibromatosis: Secondary | ICD-10-CM | POA: Diagnosis not present

## 2018-07-07 MED ORDER — NAPROXEN 500 MG PO TABS: 500.0000 mg | ORAL_TABLET | Freq: Two times a day (BID) | ORAL | 0 refills | Status: AC

## 2018-07-07 NOTE — Patient Instructions (Addendum)
Herb GraysVickie M Mastrangelo,   Thank you for coming in to clinic today.  1.            Plantar Fascia Stretches / Exercises  See other page with pictures of each exercise.  Start with 1 or 2 of these exercises that you are most comfortable with. Do not do any exercises that cause you significant worsening pain. Some of these may cause some "stretching soreness" but it should go away after you stop the exercise, and get better over time. Gradually increase up to 3-4 exercises as tolerated.  You may begin exercising the muscles of your foot right away by gently stretching them as follows:  Stretching: Towel stretch: Sit on a hard surface with your injured leg stretched out in front of you. Loop a towel around the ball of your foot and pull the towel toward your body keeping your knee straight. Hold this position for 15 to 30 seconds then relax. Repeat 3 times. When the towel stretch becomes to easy, you may begin doing the standing calf stretch.  Standing calf stretch: Facing a wall, put your hands against the wall at about eye level. Keep the injured leg back, the uninjured leg forward, and the heel of your injured leg on the floor. Turn your injured foot slightly inward (as if you were pigeon-toed) as you slowly lean into the wall until you feel a stretch in the back of your calf. Hold for 15 to 30 seconds. Repeat 3 times. Do this exercise several times each day. When you can stand comfortably on your injured foot, you can begin stretching the bottom of your foot using the plantar fascia stretch.  Plantar fascia stretch: Stand with the ball of your injured foot on a stair. Reach for the bottom step with your heel until you feel a stretch in the arch of your foot. Hold this position for 15 to 30 seconds and then relax. Repeat 3 times. After you have stretched the bottom muscles of your foot, you can begin strengthening the top muscles of your foot.  Frozen can roll: Roll your bare injured  foot back and forth from your heel to your mid-arch over a frozen juice can. Repeat for 3 to 5 minutes. This exercise is particularly helpful if done first thing in the morning.  Towel pickup: With your heel on the ground, pick up a towel with your toes. Release. Repeat 10 to 20 times. When this gets easy, add more resistance by placing a book or small weight on the towel.  Static and dynamic balance exercises: Place a chair next to your non-injured leg and stand upright. (This will provide you with balance if needed.) Stand on your injured foot. Try to raise the arch of your foot while keeping your toes on the floor. Try to maintain this position and balance on your injured side for 30 seconds. This exercise can be made more difficult by doing it on a piece of foam or a pillow, or with your eyes closed.  Stand in the same position as above. Keep your foot in this position and reach forward in front of you with your injured side's hand, allowing your knee to bend. Repeat this 10 times while maintaining the arch height. This exercise can be made more difficult by reaching farther in front of you. Do 2 sets.  Stand in the same position as above. While maintaining your arch height, reach the injured side's hand across your body toward the chair. The farther  you reach, the more challenging the exercise. Do 2 sets of 10.  Next, you can begin strengthening the muscles of your foot and lower leg by using elastic tubing.  Strengthening: Resisted dorsiflexion: Sit with your injured leg out straight and your foot facing a doorway. Tie a loop in one end of the tubing. Put your foot through the loop so that the tubing goes around the arch of your foot. Tie a knot in the other end of the tubing and shut the knot in the door. Move backward until there is tension in the tubing. Keeping your knee straight, pull your foot toward your body, stretching the tubing. Slowly return to the starting position. Do 3 sets of  10.  Resisted plantar flexion: Sit with your leg outstretched and loop the middle section of the tubing around the ball of your foot. Hold the ends of the tubing in both hands. Gently press the ball of your foot down and point your toes, stretching the tubing. Return to the starting position. Do 3 sets of 10.  Resisted inversion: Sit with your legs out straight and cross your uninjured leg over your injured ankle. Wrap the tubing around the ball of your injured foot and then loop it around your uninjured foot so that the tubing is anchored there at one end. Hold the other end of the tubing in your hand. Turn your injured foot inward and upward. This will stretch the tubing. Return to the starting position. Do 3 sets of 10.  Resisted eversion: Sit with both legs stretched out in front of you, with your feet about a shoulder's width apart. Tie a loop in one end of the tubing. Put your injured foot through the loop so that the tubing goes around the arch of that foot and wraps around the outside of the uninjured foot. Hold onto the other end of the tubing with your hand to provide tension. Turn your injured foot up and out. Make sure you keep your uninjured foot still so that it will allow the tubing to stretch as you move your injured foot. Return to the starting position. Do 3 sets of 10.   Please schedule a follow-up appointment with Wilhelmina Mcardle, AGNP. Return 2-4 weeks if symptoms worsen or fail to improve.  If you have any other questions or concerns, please feel free to call the clinic or send a message through MyChart. You may also schedule an earlier appointment if necessary.  You will receive a survey after today's visit either digitally by e-mail or paper by [employer]. Your experiences and feedback matter to [place].  Please respond so we know how we are doing as we provide care for you.   Wilhelmina Mcardle, DNP, AGNP-BC Adult Gerontology Nurse Practitioner [facility], [facility]

## 2018-07-07 NOTE — Progress Notes (Signed)
Subjective:    Patient ID: Tanya GraysVickie M Searfoss, female    DOB: [birth year], 52 y.o.   MRN: [id number]  Tanya Hill is a 52 y.o. female presenting on 07/07/2018 for Foot Pain (constant left side severe heal pain x 1 yr )   HPI LEFT heel pain X 1 year.  Started about 5 years ago when she was standing on feet constantly.  Is continuing to wear [employer] in past.  Now different shoes/asics and Dr. Margart SicklesScholl's inserts.  After sitting pain is excruciating.  Is in heel on medial side.  "feels like a nail sticking in her foot."  Ices every night with stabbing pain, but does help with foot pain associated with modification of gait.  "walks heavy," but starting to walk "lighter."   - Is taking ibuprofen occasionally for pain.   Hot flashes Is having some hot flashes, worse with caffeine.  Is having some mood swings as well.  And some "I don't give a flip" moods.     Social History   Tobacco Use  . Smoking status: Never Smoker  . Smokeless tobacco: Never Used  Substance Use Topics  . Alcohol use: No  . Drug use: No    Review of Systems Per HPI unless specifically indicated above     Objective:    BP 129/74 (BP Location: Right Arm, Patient Position: Sitting, Cuff Size: Normal)   Pulse 68   Temp 98.2 F (36.8 C) (Oral)   Ht 5\' 4"  (1.626 m)   Wt 158 lb 3.2 oz (71.8 kg)   SpO2 99%   BMI 27.15 kg/m   Wt Readings from Last 3 Encounters:  07/07/18 158 lb 3.2 oz (71.8 kg)  05/08/18 159 lb 3.2 oz (72.2 kg)  03/26/18 160 lb (72.6 kg)    Physical Exam  Constitutional: She is oriented to person, place, and time. She appears well-developed and well-nourished. No distress.  HENT:  Head: Normocephalic and atraumatic.  Neurological: She is alert and oriented to person, place, and time.  Skin: Skin is warm and dry.  Psychiatric: She has a normal mood and affect. Her behavior is normal.  Vitals reviewed.   Results for orders placed or performed during the hospital encounter of  03/26/18  CBC  Result Value Ref Range   WBC 5.5 3.6 - 11.0 K/uL   RBC 4.19 3.80 - 5.20 MIL/uL   Hemoglobin 13.2 12.0 - 16.0 g/dL   HCT 40.937.5 81.135.0 - 91.447.0 %   MCV 89.5 80.0 - 100.0 fL   MCH 31.4 26.0 - 34.0 pg   MCHC 35.1 32.0 - 36.0 g/dL   RDW 78.213.4 95.611.5 - 21.314.5 %   Platelets 255 150 - 440 K/uL  Comprehensive metabolic panel  Result Value Ref Range   Sodium 139 135 - 145 mmol/L   Potassium 3.5 3.5 - 5.1 mmol/L   Chloride 103 101 - 111 mmol/L   CO2 28 22 - 32 mmol/L   Glucose, Bld 98 65 - 99 mg/dL   BUN 20 6 - 20 mg/dL   Creatinine, Ser 0.860.83 0.44 - 1.00 mg/dL   Calcium 9.5 8.9 - 57.810.3 mg/dL   Total Protein 7.6 6.5 - 8.1 g/dL   Albumin 4.4 3.5 - 5.0 g/dL   AST 24 15 - 41 U/L   ALT 26 14 - 54 U/L   Alkaline Phosphatase 72 38 - 126 U/L   Total Bilirubin 0.5 0.3 - 1.2 mg/dL   GFR calc non Af Amer >60 >60 mL/min  GFR calc Af Amer >60 >60 mL/min   Anion gap 8 5 - 15  Troponin I  Result Value Ref Range   Troponin I <0.03 <0.03 ng/mL  TSH  Result Value Ref Range   TSH 2.385 0.350 - 4.500 uIU/mL  T4, free  Result Value Ref Range   Free T4 0.89 0.82 - 1.77 ng/dL      Assessment & Plan:   Problem List Items Addressed This Visit    None    Visit Diagnoses    Plantar fasciitis of left foot    -  Primary    Clinically consistent with chronic L plantar fasciitis based on history and exam, localized pain. Likely underlying etiology with prolonged standing - No known injury. Unlikely there is a current fracture based on history, no bony tenderness. - Considered achilles tendonitis but no overuse and location not consistent - Limited conservative therapy  Plan: 1. Reviewed diagnosis and management of plantar fasciitis 2. Start rx NSAID trial - Naproxen 440mg  BID wc x 2-4 weeks then PRN 3. May take Tylenol PRN breakthrough 4. May use topical muscle rub, ice 5. Emphasized importance of relative rest, ice, wearing supportive shoes at all times while standing, and to avoid prolonged  standing and overuse 6. Handout given and explained appropriate stretching and home exercises important to do first thing in morning and later 7. Follow-up 4-6 weeks if not improved, consider referral to Podiatry or PT.  Will defer X-rays for now.    Meds ordered this encounter  Medications  . naproxen (NAPROSYN) 500 MG tablet    Sig: Take 1 tablet (500 mg total) by mouth 2 (two) times daily with a meal for 14 days.    Dispense:  20 tablet    Refill:  0    Order Specific Question:   Supervising Provider    Answer:   Smitty Cords [[id number]]    Follow up plan: Return 2-4 weeks if symptoms worsen or fail to improve.  Wilhelmina Mcardle, DNP, AGPCNP-BC Adult Gerontology Primary Care Nurse Practitioner [facility] 07/07/2018, 11:08 AM

## 2018-07-15 ENCOUNTER — Telehealth: Payer: Self-pay

## 2018-07-15 ENCOUNTER — Telehealth: Payer: Self-pay | Admitting: Nurse Practitioner

## 2018-07-15 DIAGNOSIS — M722 Plantar fascial fibromatosis: Secondary | ICD-10-CM

## 2018-07-15 NOTE — Telephone Encounter (Signed)
Pt's foot is still hurting and she only 3 days of naproxen.   She still has shooting pain in heel.  Please call [phone]

## 2018-07-15 NOTE — Telephone Encounter (Signed)
Mychart message

## 2018-07-15 NOTE — Telephone Encounter (Signed)
Patient may transition to OTC naproxen 440 mg bid. Also, will place referral to podiatry if pt would like to see a specialist.

## 2018-07-15 NOTE — Telephone Encounter (Signed)
The pt would like her referral to podiatry sent  [facility].

## 2018-09-05 ENCOUNTER — Encounter: Payer: Self-pay | Admitting: Nurse Practitioner

## 2018-10-22 ENCOUNTER — Telehealth: Payer: Self-pay | Admitting: Nurse Practitioner

## 2018-10-22 NOTE — Telephone Encounter (Signed)
Pt asked when she was due the second Hep A shot [phone]

## 2018-10-22 NOTE — Telephone Encounter (Signed)
The pt was notified and appt scheduled for the 2nd Hep a injection.

## 2018-10-22 NOTE — Telephone Encounter (Signed)
Attempted to return the patient call, no answer. LMOM to return my call. The pt is due for her second Hep A immunization on or after 11/08/2018

## 2018-11-10 ENCOUNTER — Ambulatory Visit: Payer: Self-pay | Admitting: Nurse Practitioner

## 2018-11-11 ENCOUNTER — Ambulatory Visit: Payer: Self-pay | Admitting: Nurse Practitioner

## 2018-11-11 ENCOUNTER — Ambulatory Visit (INDEPENDENT_AMBULATORY_CARE_PROVIDER_SITE_OTHER): Payer: BC Managed Care – PPO

## 2018-11-11 DIAGNOSIS — Z23 Encounter for immunization: Secondary | ICD-10-CM | POA: Diagnosis not present

## 2018-11-12 ENCOUNTER — Ambulatory Visit: Payer: Self-pay | Admitting: Nurse Practitioner

## 2018-12-08 ENCOUNTER — Encounter: Payer: Self-pay | Admitting: Nurse Practitioner

## 2018-12-08 ENCOUNTER — Ambulatory Visit: Payer: BC Managed Care – PPO | Admitting: Nurse Practitioner

## 2018-12-08 ENCOUNTER — Other Ambulatory Visit: Payer: Self-pay

## 2018-12-08 VITALS — BP 121/54 | HR 84 | Temp 98.3°F | Ht 64.0 in | Wt 161.4 lb

## 2018-12-08 DIAGNOSIS — J069 Acute upper respiratory infection, unspecified: Secondary | ICD-10-CM | POA: Diagnosis not present

## 2018-12-08 DIAGNOSIS — L502 Urticaria due to cold and heat: Secondary | ICD-10-CM

## 2018-12-08 MED ORDER — IPRATROPIUM BROMIDE 0.06 % NA SOLN: 2.0000 | Freq: Four times a day (QID) | NASAL | 0 refills | Status: DC

## 2018-12-08 NOTE — Patient Instructions (Addendum)
Tanya Hill,   Thank you for coming in to clinic today.  1. For hives: - Take Pepcid 20 mg once daily. - If not helpful over next 2 weeks, call clinic for starting montelukast (Singulair)  2. It sounds like you have an Upper Respiratory Viral infection.  Recommend good hand washing. - Drink plenty of fluids.  - Start Atrovent nasal spray decongestant 2 sprays each nostril up to 4 times daily for 5-7 days - Continue anti-histamine loratadine 10mg  daily.  Other over the counter medications you may try, if needed for symptoms are: - If congestion is worse, start OTC Mucinex (or may try Mucinex-DM for cough) up to 7-10 days then stop - You may try over the counter Nasal Saline spray (Simply Saline, Ocean Spray) as needed to reduce congestion. - Start taking Tylenol extra strength 1 to 2 tablets every 6-8 hours for aches or fever/chills for next few days as needed.  Do not take more than 3,000 mg in 24 hours from all medicines.  You may also take ibuprofen 200-400mg  every 8 hours as needed.   - Drink warm herbal tea with honey for sore throat.   If symptoms are significantly worse with persistent fevers/chills despite tylenol/ibpurofen, nausea, vomiting unable to tolerate food/fluids or medicine, body aches, or shortness of breath, sinus pain pressure or worsening productive cough, then follow-up for re-evaluation, may seek more immediate care at Urgent Care or the ED if you are more concerned that it is an emergency.  Please schedule a follow-up appointment with Wilhelmina Mcardle, AGNP. Return if symptoms worsen or fail to improve.  If you have any other questions or concerns, please feel free to call the clinic or send a message through MyChart. You may also schedule an earlier appointment if necessary.  You will receive a survey after today's visit either digitally by e-mail or paper by [employer]. Your experiences and feedback matter to [place].  Please respond so we know how we are doing as  we provide care for you.   Wilhelmina Mcardle, DNP, AGNP-BC Adult Gerontology Nurse Practitioner [facility], [facility]

## 2018-12-08 NOTE — Progress Notes (Signed)
Subjective:    Patient ID: Tanya Hill, female    DOB: 18-Aug-1966, 53 y.o.   MRN: [id number]  Tanya GraysVickie M Read is a 53 y.o. female presenting on 12/08/2018 for Sinusitis (yellowish- brownish mucus, productive cough, nasal congestion and chills x 4 days) and Rash (hives that appear when exposed to cold weather x 6 mths)   HPI URI symptoms Patient with sinus pressure, nasal congestion, productive cough with yellow/brown mucous and chills (only Friday) x 4 days. - Had worst symptoms on Friday, felt better Saturday.  Worked 7-3, but then 5 pm had worsening of symptoms with thick throat congestion with cough.  Sunday rested all day and continues having cough with post nasal drainage.  - Took 12 hr Mucinex with some relief.  Also has had some Nyquil.  Both with good results. - Had initial congestion Jan 25 for 2-3 days, Feb 11 had congestion again for 2-3 days.  Now has had return of symptoms.  Patient admits she is not sure if this is new infection each time or lingering from initial infection.  Rash Cold weather symptoms increased rash/hives x 6 months. Hives appear regularly.  Topical Cortisone 10 helps more.  - Not improved with Claritin or benadryl. - [place]  Then again when she was at the beach this past end of summer/fall.  Social History   Tobacco Use  . Smoking status: Never Smoker  . Smokeless tobacco: Never Used  Substance Use Topics  . Alcohol use: No  . Drug use: No    Review of Systems Per HPI unless specifically indicated above     Objective:    BP (!) 121/54 (BP Location: Right Arm, Patient Position: Sitting, Cuff Size: Normal)   Pulse 84   Temp 98.3 F (36.8 C) (Oral)   Ht 5\' 4"  (1.626 m)   Wt 161 lb 6.4 oz (73.2 kg)   BMI 27.70 kg/m   Wt Readings from Last 3 Encounters:  12/08/18 161 lb 6.4 oz (73.2 kg)  07/07/18 158 lb 3.2 oz (71.8 kg)  05/08/18 159 lb 3.2 oz (72.2 kg)    Physical Exam Vitals signs reviewed.    Constitutional:      Appearance: She is well-developed.  HENT:     Head: Normocephalic and atraumatic.     Right Ear: Hearing, tympanic membrane, ear canal and external ear normal.     Left Ear: Hearing, tympanic membrane, ear canal and external ear normal.     Nose: Mucosal edema and rhinorrhea present.     Right Sinus: Frontal sinus tenderness present. No maxillary sinus tenderness.     Left Sinus: Frontal sinus tenderness present. No maxillary sinus tenderness.     Mouth/Throat:     Lips: Pink.     Mouth: Mucous membranes are moist.     Pharynx: Uvula midline. Pharyngeal swelling present. No oropharyngeal exudate (clear secretions) or uvula swelling.     Tonsils: No tonsillar exudate. Swelling: 1+ on the right. 1+ on the left.  Eyes:     General: Lids are normal.        Right eye: No discharge.        Left eye: No discharge.     Conjunctiva/sclera: Conjunctivae normal.     Pupils: Pupils are equal, round, and reactive to light.  Neck:     Musculoskeletal: Full passive range of motion without pain, normal range of motion and neck supple.  Cardiovascular:     Rate and Rhythm:  Normal rate and regular rhythm.     Pulses: Normal pulses.     Heart sounds: Normal heart sounds, S1 normal and S2 normal.  Pulmonary:     Effort: Pulmonary effort is normal. No respiratory distress.     Breath sounds: Normal breath sounds.  Abdominal:     General: Abdomen is flat.  Musculoskeletal:     Right lower leg: No edema.     Left lower leg: No edema.  Lymphadenopathy:     Cervical: Cervical adenopathy present.     Right cervical: Superficial cervical adenopathy present.     Left cervical: Superficial cervical adenopathy present.  Skin:    General: Skin is warm and dry.     Capillary Refill: Capillary refill takes less than 2 seconds.     Findings: Rash (hives present on forearms today with mild eruption severity) present.  Neurological:     General: No focal deficit present.     Mental  Status: She is alert.     GCS: GCS eye subscore is 4. GCS verbal subscore is 5. GCS motor subscore is 6.  Psychiatric:        Attention and Perception: Attention normal.        Mood and Affect: Mood normal.        Behavior: Behavior normal. Behavior is cooperative.        Thought Content: Thought content normal.        Judgment: Judgment normal.    Results for orders placed or performed during the hospital encounter of 03/26/18  CBC  Result Value Ref Range   WBC 5.5 3.6 - 11.0 K/uL   RBC 4.19 3.80 - 5.20 MIL/uL   Hemoglobin 13.2 12.0 - 16.0 g/dL   HCT 14.2 39.5 - 32.0 %   MCV 89.5 80.0 - 100.0 fL   MCH 31.4 26.0 - 34.0 pg   MCHC 35.1 32.0 - 36.0 g/dL   RDW 23.3 43.5 - 68.6 %   Platelets 255 150 - 440 K/uL  Comprehensive metabolic panel  Result Value Ref Range   Sodium 139 135 - 145 mmol/L   Potassium 3.5 3.5 - 5.1 mmol/L   Chloride 103 101 - 111 mmol/L   CO2 28 22 - 32 mmol/L   Glucose, Bld 98 65 - 99 mg/dL   BUN 20 6 - 20 mg/dL   Creatinine, Ser 1.68 0.44 - 1.00 mg/dL   Calcium 9.5 8.9 - 37.2 mg/dL   Total Protein 7.6 6.5 - 8.1 g/dL   Albumin 4.4 3.5 - 5.0 g/dL   AST 24 15 - 41 U/L   ALT 26 14 - 54 U/L   Alkaline Phosphatase 72 38 - 126 U/L   Total Bilirubin 0.5 0.3 - 1.2 mg/dL   GFR calc non Af Amer >60 >60 mL/min   GFR calc Af Amer >60 >60 mL/min   Anion gap 8 5 - 15  Troponin I  Result Value Ref Range   Troponin I <0.03 <0.03 ng/mL  TSH  Result Value Ref Range   TSH 2.385 0.350 - 4.500 uIU/mL  T4, free  Result Value Ref Range   Free T4 0.89 0.82 - 1.77 ng/dL      Assessment & Plan:   Problem List Items Addressed This Visit    None    Visit Diagnoses    Viral URI    -  Primary   Relevant Medications   ipratropium (ATROVENT) 0.06 % nasal spray    Acute illness. Fever responsive  to NSAIDs and tylenol.  Symptoms not worsening. Consistent with viral illness x 3 days with known sick contacts and no identifiable focal infections of ears, nose, throat.   Patient with episodic illness between 1/25 and today about every 2 weeks.  Suspect all are viral illnesses, but cannot exclude sinusitis completely.  Plan: 1. Reassurance, likely self-limited with cough lasting up to few weeks - Start Atrovent nasal spray decongestant 2 sprays each nostril up to 4 times daily for 5-7 days - Continue anti-histamine Loratadine 10mg  daily,  - also can use Flonase 2 sprays each nostril daily for up to 4-6 weeks - Start Mucinex-DM OTC up to 7-10 days then stop 2. Supportive care with nasal saline, warm herbal tea with honey, 3. Improve hydration 4. Tylenol / Motrin PRN fevers 5. Return criteria given     Cold uricaria Hypersensitivity reaction with no systemic symptoms to date.  Generally rare at this time due to keeping skin covered and warm, avoiding exposure to cold.  Plan: 1. Continue loratadine 10 mg once daily - despite no significant change in symptoms on this med.   2. START pepcid 20 mg one tab twice daily prn. 3. May consider use of montelukast in future. 4. May need to see allergy specialist to rule out allergic causes of hives in future. 5. Follow-up prn.  Meds ordered this encounter  Medications  . ipratropium (ATROVENT) 0.06 % nasal spray    Sig: Place 2 sprays into both nostrils 4 (four) times daily for 5 days.    Dispense:  15 mL    Refill:  0    Order Specific Question:   Supervising Provider    Answer:   Smitty Cords [[id number]]   Follow up plan: Return if symptoms worsen or fail to improve.  Wilhelmina Mcardle, DNP, AGPCNP-BC Adult Gerontology Primary Care Nurse Practitioner [facility] 12/08/2018, 9:14 AM

## 2018-12-18 ENCOUNTER — Encounter: Payer: Self-pay | Admitting: Nurse Practitioner

## 2018-12-18 ENCOUNTER — Telehealth: Payer: Self-pay | Admitting: Nurse Practitioner

## 2018-12-18 DIAGNOSIS — J069 Acute upper respiratory infection, unspecified: Secondary | ICD-10-CM

## 2018-12-18 DIAGNOSIS — J329 Chronic sinusitis, unspecified: Secondary | ICD-10-CM

## 2018-12-18 DIAGNOSIS — B9789 Other viral agents as the cause of diseases classified elsewhere: Secondary | ICD-10-CM

## 2018-12-18 MED ORDER — BENZONATATE 100 MG PO CAPS: 100.0000 mg | ORAL_CAPSULE | Freq: Two times a day (BID) | ORAL | 0 refills | Status: DC | PRN

## 2018-12-18 MED ORDER — DOXYCYCLINE HYCLATE 100 MG PO TABS: 100.0000 mg | ORAL_TABLET | Freq: Two times a day (BID) | ORAL | 0 refills | Status: DC

## 2018-12-18 NOTE — Telephone Encounter (Signed)
Pt . called said that she was better but still had a cough at night time wanted to know if you would call something in. Pt call back # is  [phone]...Mar[address] KitchenMarland KitchenWal[address]  [address]

## 2018-12-18 NOTE — Telephone Encounter (Signed)
Attempted to contact the pt, no answer. LMOM to return my call.  

## 2018-12-18 NOTE — Telephone Encounter (Signed)
I have also sent doxycycline to pharmacy.  Patient notified via MyChart.

## 2018-12-18 NOTE — Addendum Note (Signed)
Addended by: Vernard Gambles on: 12/18/2018 04:59 PM   Modules accepted: Orders

## 2018-12-18 NOTE — Telephone Encounter (Signed)
I called to notify the pt that you sent over Tessalon capsules  She questioned if the new medication would help break things up in her chest. She complains of still coughing up yellowish phlegm. She also complains of intermittent hoarseness that mostly happens later in the day. I recommended that the patient continue what you recommended Mucinex DM, herbal Tea and nasal spray. I also informed her that I would send you a message and if you wanted to make any changes I would call her back. Please advise

## 2018-12-18 NOTE — Telephone Encounter (Signed)
Sent medication to pharmacy for benzonatate.  Cough can last several weeks.

## 2019-02-18 ENCOUNTER — Encounter: Payer: Self-pay | Admitting: Nurse Practitioner

## 2019-05-25 ENCOUNTER — Ambulatory Visit (INDEPENDENT_AMBULATORY_CARE_PROVIDER_SITE_OTHER): Payer: BC Managed Care – PPO | Admitting: Nurse Practitioner

## 2019-05-25 ENCOUNTER — Other Ambulatory Visit: Payer: Self-pay

## 2019-05-25 ENCOUNTER — Encounter: Payer: Self-pay | Admitting: Nurse Practitioner

## 2019-05-25 VITALS — BP 97/54 | HR 83 | Ht 64.0 in | Wt 159.6 lb

## 2019-05-25 DIAGNOSIS — F418 Other specified anxiety disorders: Secondary | ICD-10-CM | POA: Diagnosis not present

## 2019-05-25 DIAGNOSIS — Z13820 Encounter for screening for osteoporosis: Secondary | ICD-10-CM

## 2019-05-25 DIAGNOSIS — Z0000 Encounter for general adult medical examination without abnormal findings: Secondary | ICD-10-CM | POA: Diagnosis not present

## 2019-05-25 DIAGNOSIS — Z1211 Encounter for screening for malignant neoplasm of colon: Secondary | ICD-10-CM | POA: Diagnosis not present

## 2019-05-25 DIAGNOSIS — Z1239 Encounter for other screening for malignant neoplasm of breast: Secondary | ICD-10-CM | POA: Diagnosis not present

## 2019-05-25 DIAGNOSIS — G4485 Primary stabbing headache: Secondary | ICD-10-CM

## 2019-05-25 DIAGNOSIS — Z23 Encounter for immunization: Secondary | ICD-10-CM | POA: Diagnosis not present

## 2019-05-25 NOTE — Progress Notes (Signed)
Subjective:    Patient ID: Tanya Hill, female    DOB: [birth year], 53 y.o.   MRN: [id number]  Tanya Hill is a 53 y.o. female presenting on 05/25/2019 for Annual Exam (eposide with a severe intense headaches that started after hiking [place] the [place] in [place] elev[place]. x 1 mth ago that last about 5 minutes)  HPI Headache Patient has had climb up mountain to 3500 ft.  Had a headache with severe pain for 5 minutes.  Had indescribable pain that started in front and traveled all over her head and to upper neck.  Then she hydrated well and has not had recurrence.  Also took 2 ibuprofen (instead of 1 usually).  Ate and drank a gatorade with full recovery.    Annual Physical Exam Patient has been feeling well.  They have no other acute concerns today. Sleeps 6-7 hours per night uninterrupted.  HEALTH MAINTENANCE: Weight/BMI: overweight, generally stable Physical activity: regular Diet: healthy Seatbelt: always Sunscreen: regularly PAP: s/p hysterectomy Mammogram: due DEXA: due - postmenopausal Colon Cancer Screen: due HIV/HEP C: neg in past Optometry: regular Dentistry: regular  VACCINES: Tetanus: due Influenza: regularly Shingles: recommended  Past Medical History:  Diagnosis Date  . Endometriosis   . 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 Hypertension 2017   Past Surgical History:  Procedure Laterality Date  . ABDOMINAL HYSTERECTOMY  2008  . CESAREAN SECTION  1990  . LAPAROSCOPIC SALPINGO OOPHERECTOMY Right 02/25/2017   Procedure: LAPAROSCOPIC RIGHT SALPINGO OOPHORECTOMY;  Surgeon: Harlin Heys, MD;  Location: [facility] ORS;  Service: Gynecology;  Laterality: Right;  . LAPAROTOMY N/A 02/25/2017   Procedure: LAPAROTOMY;  Surgeon: Harlin Heys, MD;  Location: [facility] ORS;  Service: Gynecology;  Laterality: N/A;  . OOPHORECTOMY Left    Social History   Socioeconomic History  . Marital status: Divorced    Spouse name: Not on file  . Number of children:  Not on file  . Years of education: Not on file  . Highest education level: Not on file  Occupational History  . Not on file  Social Needs  . Financial resource strain: Not on file  . Food insecurity    Worry: Not on file    Inability: Not on file  . Transportation needs    Medical: Not on file    Non-medical: Not on file  Tobacco Use  . Smoking status: Never Smoker  . Smokeless tobacco: Never Used  Substance and Sexual Activity  . Alcohol use: No  . Drug use: No  . Sexual activity: Not Currently    Birth control/protection: None  Lifestyle  . Physical activity    Days per week: Not on file    Minutes per session: Not on file  . Stress: Not on file  Relationships  . Social Herbalist on phone: Not on file    Gets together: Not on file    Attends religious service: Not on file    Active member of club or organization: Not on file    Attends meetings of clubs or organizations: Not on file    Relationship status: Not on file  . Intimate partner violence    Fear of current or ex partner: Not on file    Emotionally abused: Not on file    Physically abused: Not on file    Forced sexual activity: Not on file  Other Topics Concern  . Not on file  Social History Narrative  .  Not on file   Family History  Problem Relation Age of Onset  . Hypertension Mother   . Stroke Mother   . Cancer Father        Kidney, prostate  . Hyperlipidemia Father   . Healthy Brother    Current Outpatient Medications on File Prior to Visit  Medication Sig  . Cholecalciferol (VITAMIN D) 2000 units tablet Take 2,000 Units by mouth daily.  Marland Kitchen. loratadine (CLARITIN) 10 MG tablet Take 10 mg by mouth daily.  . Multiple Vitamins-Minerals (MULTIVITAMIN WITH MINERALS) tablet Take 1 tablet by mouth daily.  Marland Kitchen. ipratropium (ATROVENT) 0.06 % nasal spray Place 2 sprays into both nostrils 4 (four) times daily for 5 days.   No current facility-administered medications on file prior to visit.      Review of Systems Per HPI unless specifically indicated above     Objective:    BP (!) 97/54 (BP Location: Left Arm, Patient Position: Sitting, Cuff Size: Normal)   Pulse 83   Ht 5\' 4"  (1.626 m)   Wt 159 lb 9.6 oz (72.4 kg)   BMI 27.40 kg/m   Wt Readings from Last 3 Encounters:  05/25/19 159 lb 9.6 oz (72.4 kg)  12/08/18 161 lb 6.4 oz (73.2 kg)  07/07/18 158 lb 3.2 oz (71.8 kg)    Physical Exam Vitals signs and nursing note reviewed.  Constitutional:      General: She is not in acute distress.    Appearance: Normal appearance. She is well-developed and normal weight.  HENT:     Head: Normocephalic and atraumatic.     Right Ear: External ear normal.     Left Ear: External ear normal.     Nose: Nose normal.  Eyes:     Conjunctiva/sclera: Conjunctivae normal.     Pupils: Pupils are equal, round, and reactive to light.  Neck:     Musculoskeletal: Normal range of motion and neck supple.     Thyroid: No thyromegaly.     Vascular: No JVD.     Trachea: No tracheal deviation.  Cardiovascular:     Rate and Rhythm: Normal rate and regular rhythm.     Heart sounds: Normal heart sounds. No murmur. No friction rub. No gallop.   Pulmonary:     Effort: Pulmonary effort is normal. No respiratory distress.     Breath sounds: Normal breath sounds.  Chest:     Comments: Breast - Normal exam w/ symmetric breasts, no mass, no nipple discharge, no skin changes or tenderness.  Abdominal:     General: Bowel sounds are normal. There is no distension.     Palpations: Abdomen is soft.     Tenderness: There is no abdominal tenderness.  Musculoskeletal: Normal range of motion.  Lymphadenopathy:     Cervical: No cervical adenopathy.  Skin:    General: Skin is warm and dry.     Capillary Refill: Capillary refill takes less than 2 seconds.  Neurological:     General: No focal deficit present.     Mental Status: She is alert and oriented to person, place, and time. Mental status is at baseline.      Cranial Nerves: No cranial nerve deficit.  Psychiatric:        Attention and Perception: Attention normal.        Mood and Affect: Affect normal. Mood is anxious.        Behavior: Behavior normal.        Thought Content: Thought content normal.  Judgment: Judgment normal.    Results for orders placed or performed during the hospital encounter of 03/26/18  CBC  Result Value Ref Range   WBC 5.5 3.6 - 11.0 K/uL   RBC 4.19 3.80 - 5.20 MIL/uL   Hemoglobin 13.2 12.0 - 16.0 g/dL   HCT 16.137.5 09.635.0 - 04.547.0 %   MCV 89.5 80.0 - 100.0 fL   MCH 31.4 26.0 - 34.0 pg   MCHC 35.1 32.0 - 36.0 g/dL   RDW 40.913.4 81.111.5 - 91.414.5 %   Platelets 255 150 - 440 K/uL  Comprehensive metabolic panel  Result Value Ref Range   Sodium 139 135 - 145 mmol/L   Potassium 3.5 3.5 - 5.1 mmol/L   Chloride 103 101 - 111 mmol/L   CO2 28 22 - 32 mmol/L   Glucose, Bld 98 65 - 99 mg/dL   BUN 20 6 - 20 mg/dL   Creatinine, Ser 7.820.83 0.44 - 1.00 mg/dL   Calcium 9.5 8.9 - 95.610.3 mg/dL   Total Protein 7.6 6.5 - 8.1 g/dL   Albumin 4.4 3.5 - 5.0 g/dL   AST 24 15 - 41 U/L   ALT 26 14 - 54 U/L   Alkaline Phosphatase 72 38 - 126 U/L   Total Bilirubin 0.5 0.3 - 1.2 mg/dL   GFR calc non Af Amer >60 >60 mL/min   GFR calc Af Amer >60 >60 mL/min   Anion gap 8 5 - 15  Troponin I  Result Value Ref Range   Troponin I <0.03 <0.03 ng/mL  TSH  Result Value Ref Range   TSH 2.385 0.350 - 4.500 uIU/mL  T4, free  Result Value Ref Range   Free T4 0.89 0.82 - 1.77 ng/dL      Assessment & Plan:   Problem List Items Addressed This Visit    None    Visit Diagnoses    Encounter for annual physical exam    -  Primary   Relevant Orders   TSH + free T4   Lipid panel   COMPLETE METABOLIC PANEL WITH GFR   Hemoglobin A1c   CBC with Differential/Platelet   Need for diphtheria-tetanus-pertussis (Tdap) vaccine       Relevant Orders   Tdap vaccine greater than or equal to 7yo IM (Completed)   Breast cancer screening       Relevant  Orders   MM DIGITAL SCREENING BILATERAL   Colon cancer screening       Relevant Orders   Ambulatory referral to Gastroenterology   Osteoporosis screening       Relevant Orders   DG Bone Density   Primary stabbing headache          Annual physical exam with new findings of headache and situational anxiety.  Well adult with no acute concerns.  Plan: 1. Obtain health maintenance screenings as above according to age. - Increase physical activity to 30 minutes most days of the week.  - Eat healthy diet high in vegetables and fruits; low in refined carbohydrates. - Screening labs and tests as ordered above (mammo, dexa, colonoscopy) - Immunizations recommended: Tdap (given today) and Shingrix 2. Discussed anxiety management - largely situational.  Continue therapy for COVID healthcare worker.  Call clinic if needing to discuss medication options. 3. Return 1 year for annual physical.    Headache: Stabbing headache, self-limited and isolated to one episode to date.  Reviewed thunderclap headache and when to seek emergency care. Encouraged regular hydration, limiting extreme exertional activities.   -  Follow-up prn if repeat symptoms occur.  Follow up plan: Return in about 1 year (around 05/24/2020) for annual physical.  Wilhelmina McardleLauren Bernadette Gores, DNP, AGPCNP-BC Adult Gerontology Primary Care Nurse Practitioner [facility] 05/25/2019, 2:01 PM

## 2019-05-25 NOTE — Patient Instructions (Addendum)
Tanya Hill,   Thank you for coming in to clinic today.  1. Continue working on regular stress management.    2. Eat a healthy diet without lots of sugar.  3. Stay active.  4. You are now recommended to get the shingles vaccine, Shingrix.  It requires 2 doses 2-6 months apart.  http://www.wolf.info/ for more information if you have questions.   5. Your mammogram order has been placed.  Call the Scheduling phone number at [phone] to schedule your mammogram at your convenience.  You can choose to go to either location listed below.  Let the scheduler know which location you prefer. **Also schedule your DEXA scan there as well [facility]  [facility]  [place], [place] [address]   [facility] [address] [place], [place] [address]   You will be due for [place].  This means you should eat no food or drink after midnight.  Drink only water or coffee without cream/sugar on the morning of your lab visit. - Please go ahead and schedule a "Lab Only" visit in the morning at the clinic for lab draw in the next 7 days. - Your results will be available about 2-3 days after blood draw.  If you have set up a MyChart account, you can can log in to MyChart online to view your results and a brief explanation. Also, we can discuss your results together at your next office visit if you would like.   Please schedule a follow-up appointment with Cassell Smiles, AGNP. Return in about 1 year (around 05/24/2020) for annual physical.  If you have any other questions or concerns, please feel free to call the clinic or send a message through [place]. You may also schedule an earlier appointment if necessary.  You will receive a survey after today's visit either digitally by e-mail or paper by [employer]. Your experiences and feedback matter to [place].  Please respond so we know how we are doing as  we provide care for you.   Cassell Smiles, DNP, AGNP-BC Adult Gerontology Nurse Practitioner [place]

## 2019-05-26 ENCOUNTER — Telehealth: Payer: Self-pay

## 2019-05-26 ENCOUNTER — Other Ambulatory Visit: Payer: BC Managed Care – PPO

## 2019-05-26 ENCOUNTER — Other Ambulatory Visit: Payer: Self-pay

## 2019-05-26 DIAGNOSIS — Z1211 Encounter for screening for malignant neoplasm of colon: Secondary | ICD-10-CM

## 2019-05-26 DIAGNOSIS — Z83719 Family history of colon polyps, unspecified: Secondary | ICD-10-CM

## 2019-05-26 DIAGNOSIS — Z8371 Family history of colonic polyps: Secondary | ICD-10-CM

## 2019-05-26 NOTE — Telephone Encounter (Signed)
Gastroenterology Pre-Procedure Review  Request Date: 06/15/19 Requesting Physician: Dr. Marius Ditch  PATIENT REVIEW QUESTIONS: The patient responded to the following health history questions as indicated:    1. Are you having any GI issues? no 2. Do you have a personal history of Polyps? no 3. Do you have a family history of Colon Cancer or Polyps? yes (family history of colon polyps) 4. Diabetes Mellitus? no 5. Joint replacements in the past 12 months?no 6. Major health problems in the past 3 months?no 7. Any artificial heart valves, MVP, or defibrillator?no    MEDICATIONS & ALLERGIES:    Patient reports the following regarding taking any anticoagulation/antiplatelet therapy:   Plavix, Coumadin, Eliquis, Xarelto, Lovenox, Pradaxa, Brilinta, or Effient? no Aspirin? no  Patient confirms/reports the following medications:  Current Outpatient Medications  Medication Sig Dispense Refill  . Cholecalciferol (VITAMIN D) 2000 units tablet Take 2,000 Units by mouth daily.    Marland Kitchen ipratropium (ATROVENT) 0.06 % nasal spray Place 2 sprays into both nostrils 4 (four) times daily for 5 days. 15 mL 0  . loratadine (CLARITIN) 10 MG tablet Take 10 mg by mouth daily.    . Multiple Vitamins-Minerals (MULTIVITAMIN WITH MINERALS) tablet Take 1 tablet by mouth daily.     No current facility-administered medications for this visit.     Patient confirms/reports the following allergies:  Allergies  Allergen Reactions  . Latex Hives  . Penicillins Rash    Has patient had a PCN reaction causing immediate rash, facial/tongue/throat swelling, SOB or lightheadedness with hypotension: yes Has patient had a PCN reaction causing severe rash involving mucus membranes or skin necrosis: no Has patient had a PCN reaction that required hospitalization no Has patient had a PCN reaction occurring within the last 10 years: no If all of the above answers are "NO", then may proceed with Cephalosporin use.     No orders of  the defined types were placed in this encounter.   AUTHORIZATION INFORMATION Primary Insurance: 1D#: Group #:  Secondary Insurance: 1D#: Group #:  SCHEDULE INFORMATION: Date: 06/15/19 Time: Location:[facility]

## 2019-05-27 LAB — CBC WITH DIFFERENTIAL/PLATELET
Absolute Monocytes: 248 cells/uL (ref 200–950)
Basophils Absolute: 41 cells/uL (ref 0–200)
Basophils Relative: 1.2 %
Eosinophils Absolute: 139 cells/uL (ref 15–500)
Eosinophils Relative: 4.1 %
HCT: 40.9 % (ref 35.0–45.0)
Hemoglobin: 13.5 g/dL (ref 11.7–15.5)
Lymphs Abs: 1142 cells/uL (ref 850–3900)
MCH: 30.3 pg (ref 27.0–33.0)
MCHC: 33 g/dL (ref 32.0–36.0)
MCV: 91.7 fL (ref 80.0–100.0)
MPV: 11.4 fL (ref 7.5–12.5)
Monocytes Relative: 7.3 %
Neutro Abs: 1829 cells/uL (ref 1500–7800)
Neutrophils Relative %: 53.8 %
Platelets: 262 10*3/uL (ref 140–400)
RBC: 4.46 10*6/uL (ref 3.80–5.10)
RDW: 12.8 % (ref 11.0–15.0)
Total Lymphocyte: 33.6 %
WBC: 3.4 10*3/uL — ABNORMAL LOW (ref 3.8–10.8)

## 2019-05-27 LAB — HEMOGLOBIN A1C
Hgb A1c MFr Bld: 5.6 % of total Hgb (ref <5.7)
Mean Plasma Glucose: 114 (calc)
eAG (mmol/L): 6.3 (calc)

## 2019-05-27 LAB — COMPLETE METABOLIC PANEL WITH GFR
AG Ratio: 1.7 (calc) (ref 1.0–2.5)
ALT: 18 U/L (ref 6–29)
AST: 18 U/L (ref 10–35)
Albumin: 4.5 g/dL (ref 3.6–5.1)
Alkaline phosphatase (APISO): 84 U/L (ref 37–153)
BUN: 16 mg/dL (ref 7–25)
CO2: 32 mmol/L (ref 20–32)
Calcium: 10 mg/dL (ref 8.6–10.4)
Chloride: 105 mmol/L (ref 98–110)
Creat: 0.86 mg/dL (ref 0.50–1.05)
GFR, Est African American: 91 mL/min/{1.73_m2} (ref >=60)
GFR, Est Non African American: 78 mL/min/{1.73_m2} (ref >=60)
Globulin: 2.6 g/dL (calc) (ref 1.9–3.7)
Glucose, Bld: 91 mg/dL (ref 65–99)
Potassium: 4.2 mmol/L (ref 3.5–5.3)
Sodium: 142 mmol/L (ref 135–146)
Total Bilirubin: 0.6 mg/dL (ref 0.2–1.2)
Total Protein: 7.1 g/dL (ref 6.1–8.1)

## 2019-05-27 LAB — LIPID PANEL
Cholesterol: 215 mg/dL — ABNORMAL HIGH (ref <200)
HDL: 47 mg/dL — ABNORMAL LOW (ref >=50)
LDL Cholesterol (Calc): 149 mg/dL (calc) — ABNORMAL HIGH
Non-HDL Cholesterol (Calc): 168 mg/dL (calc) — ABNORMAL HIGH (ref <130)
Total CHOL/HDL Ratio: 4.6 (calc) (ref <5.0)
Triglycerides: 86 mg/dL (ref <150)

## 2019-05-27 LAB — TSH+FREE T4: TSH W/REFLEX TO FT4: 1.04 mIU/L

## 2019-05-29 ENCOUNTER — Telehealth: Payer: Self-pay | Admitting: Nurse Practitioner

## 2019-05-29 NOTE — Telephone Encounter (Signed)
Pt called want someone to call back  About  labs

## 2019-06-03 ENCOUNTER — Telehealth: Payer: Self-pay | Admitting: Gastroenterology

## 2019-06-03 NOTE — Telephone Encounter (Signed)
Pt  Is calling to cancel her procedure for  06/15/19 she will call back to r/s sometime for September  She just started a new position and needs to get her schedule

## 2019-06-03 NOTE — Telephone Encounter (Signed)
Patients call has been returned to rescheduled her colonoscopy (06/15/19 [place]) due to new position at work.  She said she will call the office back to reschedule next week once she gets her new work schedule.  Jocelyn Lamer in Endo has been made aware of cancellation.  Thanks [employer]

## 2019-06-14 IMAGING — US US THYROID
2 series · 13 of 25 positions shown · non-contrast
Comparison: 09/05/2015 and previous back to 06/23/2013

CLINICAL DATA: Goiter. Previous left lower nodule biopsy
09/24/2014.

EXAM:
THYROID ULTRASOUND
TECHNIQUE: Ultrasound examination of the thyroid gland and adjacent soft
tissues was performed.

[Series 1: us thyroid · 0.07mm/px · 9 of 55 slices shown (1 of 2)]
[im 1/55]
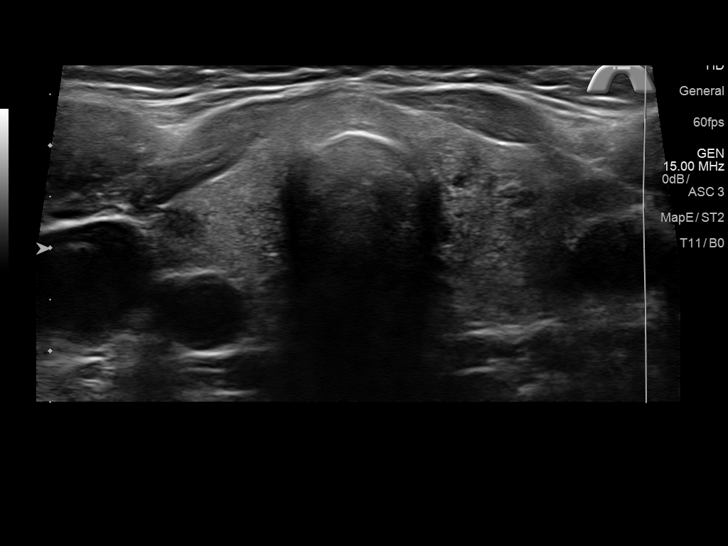
[im 7/55]
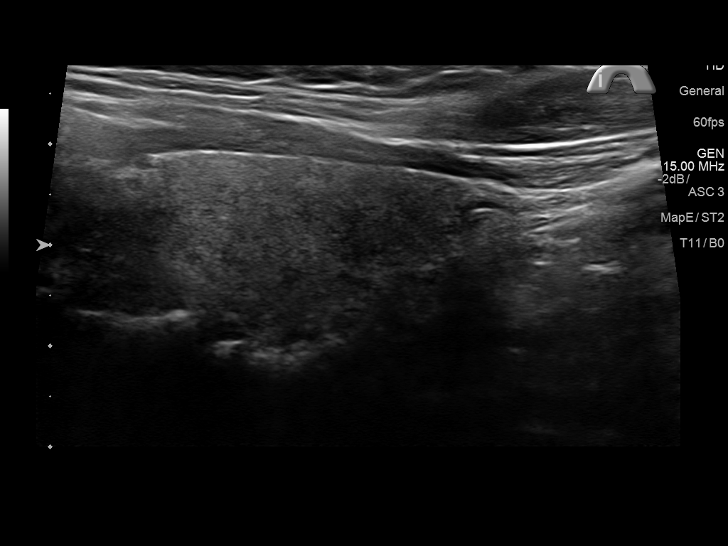
[im 14/55]
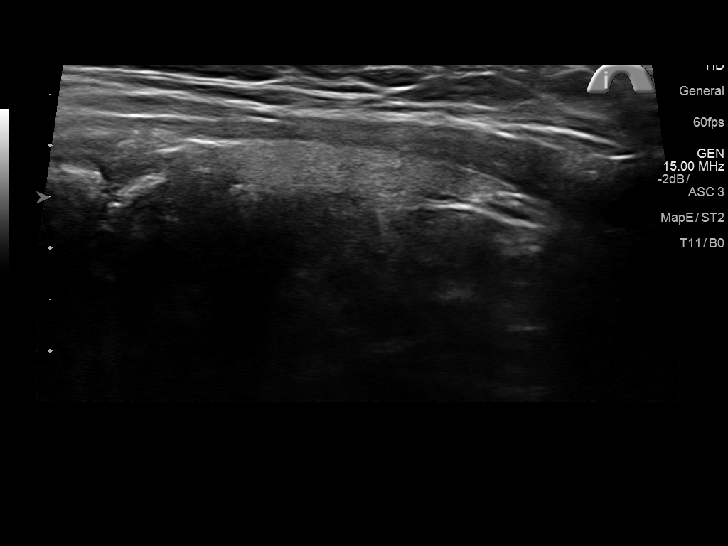
[im 21/55]
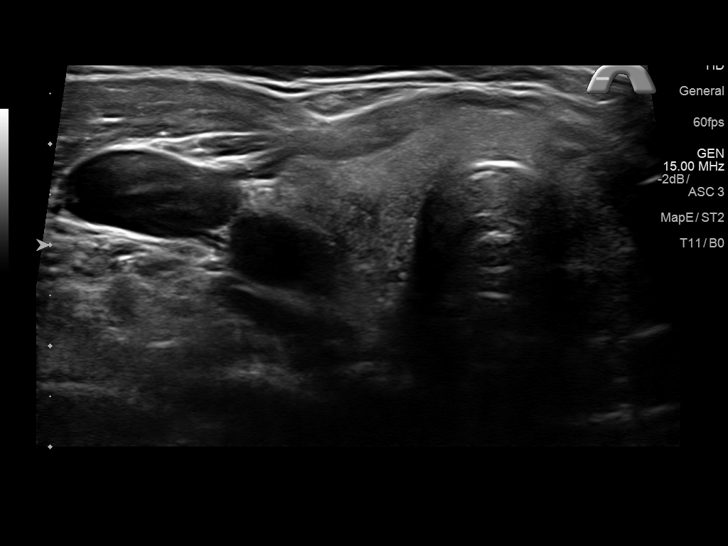
[im 28/55]
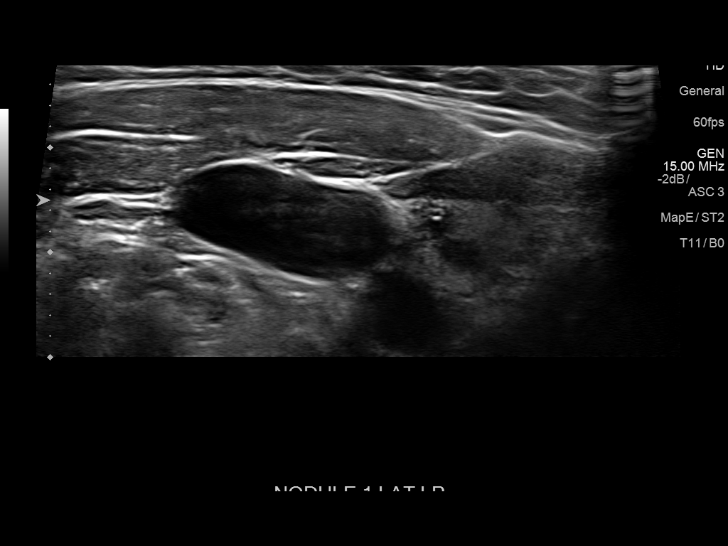
[im 34/55]
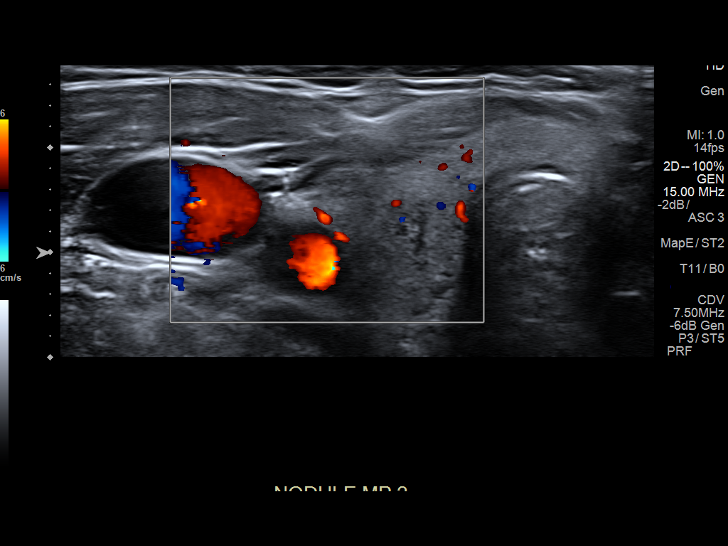
[im 41/55]
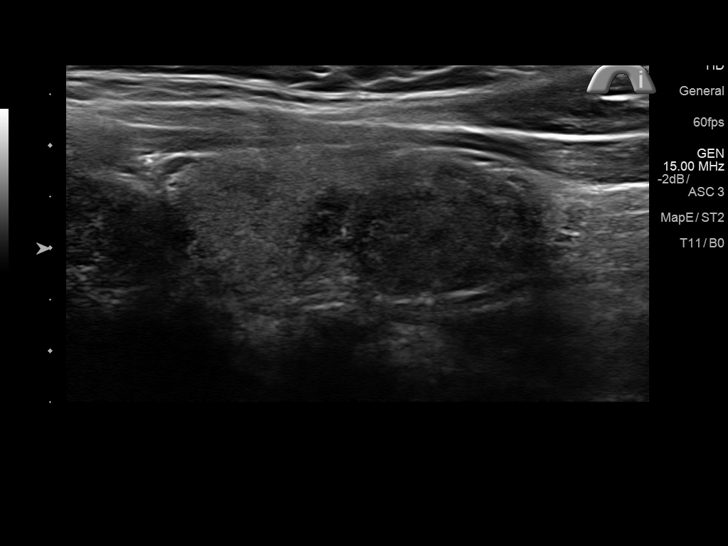
[im 48/55]
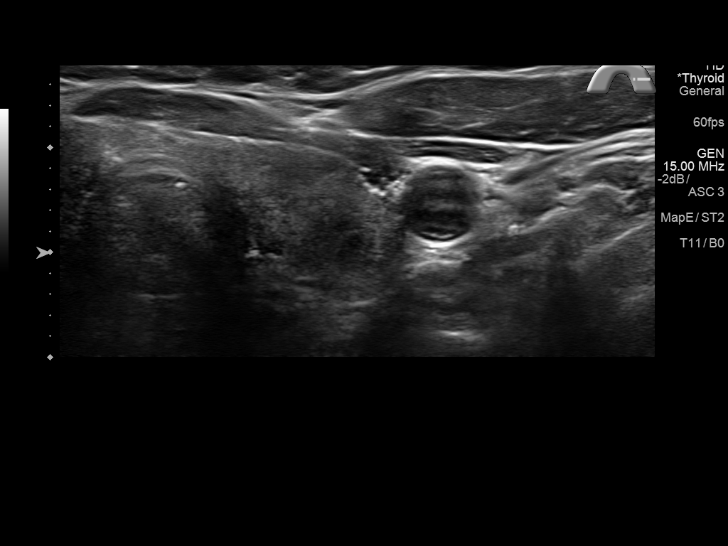
[im 55/55]
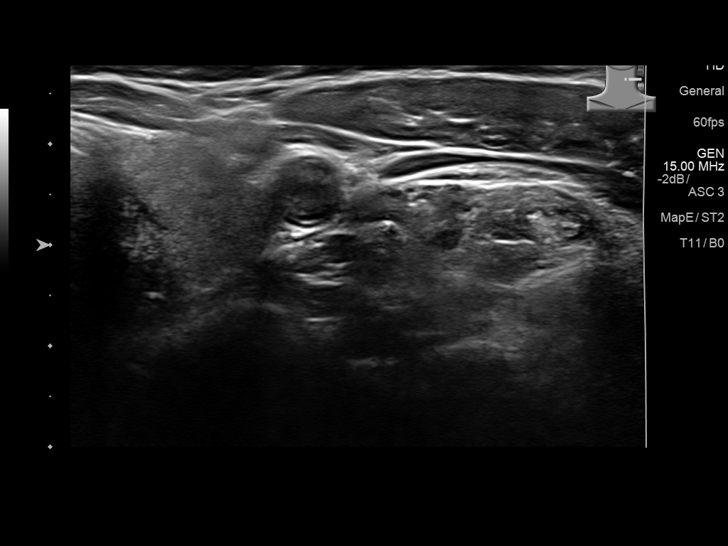

[Series 2001: us thyroid · 0.07mm/px · 4 of 25 slices shown (2 of 2)]
[im 4/25]
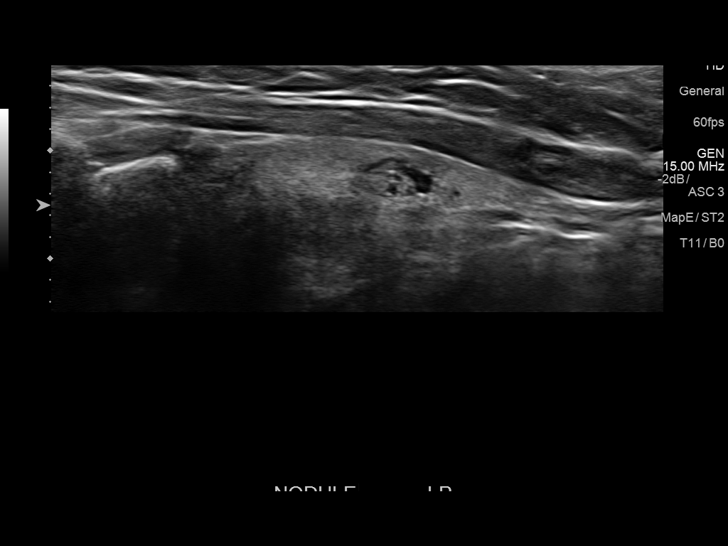
[im 11/25]
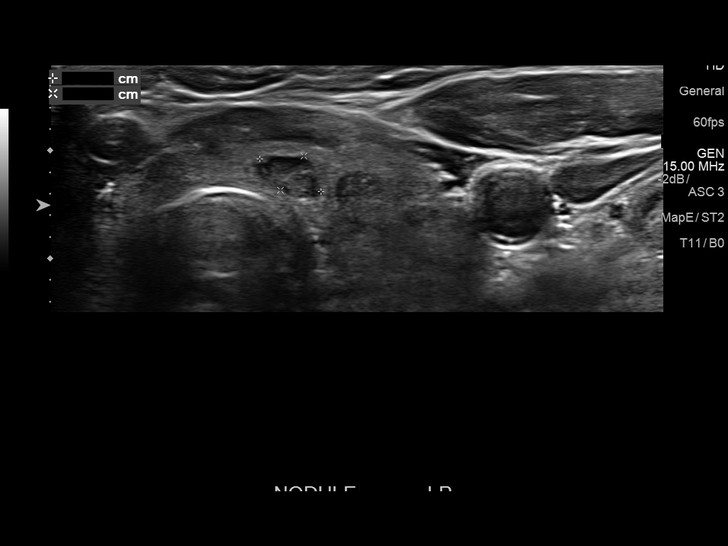
[im 18/25]
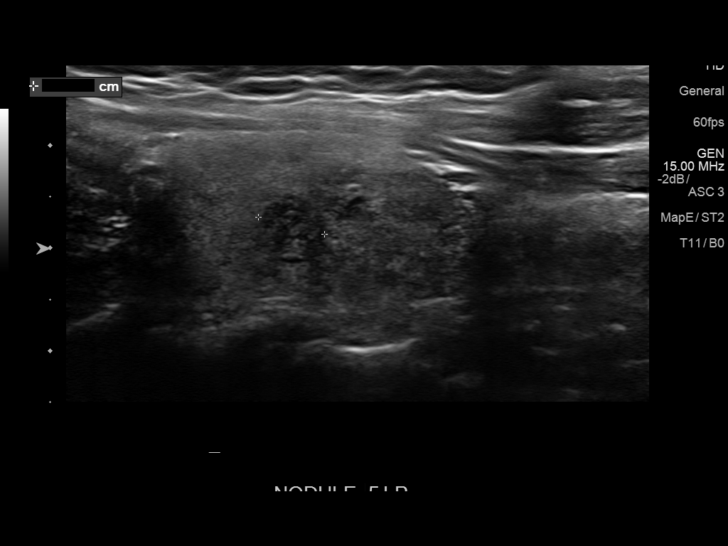
[im 25/25]
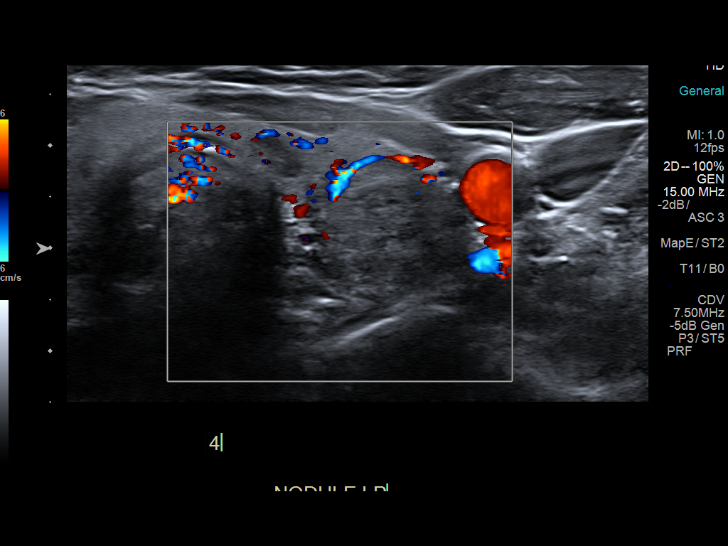

[13 of 25 positions shown; findings below may reference images not displayed]

FINDINGS: Parenchymal Echotexture: Mildly heterogenous

Isthmus: 0.3 cm thickness, previously

Right lobe: 3.4 x 1.8 x 1.4 cm, previously 4.3 x 1.8 x

Left lobe: 3.8 x 1.6 x 1.7 cm, previously 3.9 x 1.6 x

_________________________________________________________

Estimated total number of nodules >/= 1 cm: 1

Number of spongiform nodules >/=  2 cm not described below (TR1): 0

Number of mixed cystic and solid nodules >/= 1.5 cm not described
below (TR2): 0

_________________________________________________________

1.6 x 1.4 x 1.5 cm hypoechoic nodule without calcifications,
inferior left, previously 1.6 x 1.1 x 1 ; this was previously
biopsied.

Additional small bilateral nodules measuring 7 mm or less without
worrisome features.
IMPRESSION: 1. Normal-sized thyroid with small bilateral nodules. The largest
has already been biopsied. No others meet current criteria for
biopsy or dedicated imaging follow-up.

The above is in keeping with the ACR TI-RADS recommendations - [HOSPITAL] 7158;[DATE].

## 2019-06-15 ENCOUNTER — Ambulatory Visit: Admit: 2019-06-15 | Payer: BC Managed Care – PPO | Admitting: Gastroenterology

## 2019-06-15 SURGERY — COLONOSCOPY WITH PROPOFOL
Anesthesia: General

## 2019-06-23 ENCOUNTER — Other Ambulatory Visit: Payer: Self-pay

## 2019-06-23 ENCOUNTER — Telehealth: Payer: Self-pay | Admitting: Gastroenterology

## 2019-06-23 DIAGNOSIS — Z8371 Family history of colonic polyps: Secondary | ICD-10-CM

## 2019-06-23 DIAGNOSIS — Z1211 Encounter for screening for malignant neoplasm of colon: Secondary | ICD-10-CM

## 2019-06-23 DIAGNOSIS — Z83719 Family history of colon polyps, unspecified: Secondary | ICD-10-CM

## 2019-06-23 NOTE — Progress Notes (Signed)
Call returned colonoscopy scheduled with Dr. Marius Ditch on 09/30/19 at [facility].  COVID test 09/25/19.  New instructions will be mailed to patient.  Thanks [employer]

## 2019-06-23 NOTE — Telephone Encounter (Signed)
Patient called & l/m stating she needed to r/s colonoscopy on a Wednesday in December.

## 2019-07-02 ENCOUNTER — Encounter: Payer: Self-pay | Admitting: Cardiology

## 2019-07-02 DIAGNOSIS — R079 Chest pain, unspecified: Secondary | ICD-10-CM

## 2019-07-02 DIAGNOSIS — R002 Palpitations: Secondary | ICD-10-CM

## 2019-07-03 ENCOUNTER — Encounter: Payer: Self-pay | Admitting: Cardiology

## 2019-07-03 DIAGNOSIS — R002 Palpitations: Secondary | ICD-10-CM | POA: Diagnosis not present

## 2019-07-03 DIAGNOSIS — R079 Chest pain, unspecified: Secondary | ICD-10-CM | POA: Diagnosis not present

## 2019-07-03 DIAGNOSIS — E785 Hyperlipidemia, unspecified: Secondary | ICD-10-CM | POA: Diagnosis not present

## 2019-07-21 ENCOUNTER — Encounter: Payer: Self-pay | Admitting: Nurse Practitioner

## 2019-07-21 ENCOUNTER — Ambulatory Visit: Payer: BC Managed Care – PPO | Admitting: Nurse Practitioner

## 2019-07-21 ENCOUNTER — Other Ambulatory Visit: Payer: Self-pay

## 2019-07-21 VITALS — BP 116/66 | HR 68 | Ht 64.0 in | Wt 160.2 lb

## 2019-07-21 DIAGNOSIS — R000 Tachycardia, unspecified: Secondary | ICD-10-CM | POA: Diagnosis not present

## 2019-07-21 NOTE — Patient Instructions (Addendum)
Tanya Hill,   Thank you for coming in to clinic today.  1. Cardiology referral placed today for additional workup.  Possibly need a long-term Holter monitor to capture more of your HR variability.  The risk is there is no abnormal rhythm while you are wearing it.  Discuss this with Cardiology.  2. Stress is another cause and should be controlled.   Please schedule a follow-up appointment. Return if symptoms worsen or fail to improve.  If you have any other questions or concerns, please feel free to call the clinic or send a message through [place]. You may also schedule an earlier appointment if necessary.  You will receive a survey after today's visit either digitally by e-mail or paper by [employer]. Your experiences and feedback matter to [place].  Please respond so we know how we are doing as we provide care for you.  Cassell Smiles, DNP, AGNP-BC Adult Gerontology Nurse Practitioner [place]

## 2019-07-21 NOTE — Progress Notes (Signed)
Subjective:    Patient ID: Tanya Hill, female    DOB: 22-Jun-1966, 53 y.o.   MRN: [id number]  Tanya Hill is a 53 y.o. female presenting on 07/21/2019 for Hospitalization Follow-up (tachycardia, cardiac workup x 3 weeks ago )   HPI ER FOLLOW-UP VISIT Hospital/Location: [facility] - No D/C summary available Date of Admission: 07/02/2019 with overnight obs admission  Date of Discharge: 07/03/2019 Transitions of care telephone call: n/a  Reason for Admission: Lightheadedness, tachycardia  FOLLOW-UP  On 9/15, patient was working when symptoms started.  Patient felt indigestion, then had heart racing up to HR 200 with inability to get HR to slow down and no dizziness. Patient was working nights at that point.  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  Patient was also starting on a new unit that day.  Patient was not on Covid unit that day.  Patient is now at [facility] and most Covid is at [facility] main.  Patient notes onset 9/15 and resolution of symptoms before ER could complete her visit, so she went home.   Then, on 9/17, she felt shortness of breath and had difficulty pushing a chair across a hardwood floor.  Then was 5 mins from [place] so went to [place] ER/hospital workup, discharge on 9/19.  Patient had chest pain/back pain behind left shoulder blade.  Patient has had variable HR in past.  Has had no recurrences of HR changes.    [facility] labs and test results have been reviewed as patient brought copies of these reports with her today.  Patient had lab workup to include Lipid, Chemistry, troponin, positive D dimer, EKG report showing NSR, CT angio with negative findings and no Hill, lexiscan with normal findings, Negative venous duplex, negative sestamibi stress test. - New medications on discharge: none - Changes to current meds on discharge: none  Social History   Tobacco Use  . Smoking status: Never Smoker  . Smokeless tobacco: Never Used  Substance  Use Topics  . Alcohol use: No  . Drug use: No    Review of Systems Per HPI unless specifically indicated above  Outpatient Encounter Medications as of 07/21/2019  Medication Sig  . Cholecalciferol (VITAMIN D) 2000 units tablet Take 2,000 Units by mouth daily.  Marland Kitchen ipratropium (ATROVENT) 0.06 % nasal spray Place 2 sprays into both nostrils 4 (four) times daily for 5 days.  Marland Kitchen loratadine (CLARITIN) 10 MG tablet Take 10 mg by mouth daily.  . Multiple Vitamins-Minerals (MULTIVITAMIN WITH MINERALS) tablet Take 1 tablet by mouth daily.   No facility-administered encounter medications on file as of 07/21/2019.        Objective:    There were no vitals taken for this visit.  Wt Readings from Last 3 Encounters:  05/25/19 159 lb 9.6 oz (72.4 kg)  12/08/18 161 lb 6.4 oz (73.2 kg)  07/07/18 158 lb 3.2 oz (71.8 kg)    Physical Exam Vitals signs reviewed.  Constitutional:      General: She is not in acute distress.    Appearance: She is well-developed.  HENT:     Head: Normocephalic and atraumatic.  Cardiovascular:     Rate and Rhythm: Normal rate and regular rhythm.     Pulses: Normal pulses.          Radial pulses are 2+ on the right side and 2+ on the left side.       Posterior tibial pulses are 2+ on the right side and 2+ on  the left side.     Heart sounds: Normal heart sounds, S1 normal and S2 normal. No murmur. No friction rub. No gallop.   Pulmonary:     Effort: Pulmonary effort is normal. No respiratory distress.     Breath sounds: Normal breath sounds and air entry.  Musculoskeletal:     Right lower leg: No edema.     Left lower leg: No edema.  Skin:    General: Skin is warm and dry.     Capillary Refill: Capillary refill takes less than 2 seconds.  Neurological:     General: No focal deficit present.     Mental Status: She is alert and oriented to person, place, and time. Mental status is at baseline.  Psychiatric:        Attention and Perception: Attention normal.         Mood and Affect: Affect normal. Mood is anxious.        Behavior: Behavior normal. Behavior is cooperative.        Thought Content: Thought content normal.        Judgment: Judgment normal.     Results for orders placed or performed in visit on 05/25/19  TSH + free T4  Result Value Ref Range   TSH W/REFLEX TO FT4 1.04 mIU/L  Lipid panel  Result Value Ref Range   Cholesterol 215 (H) <200 mg/dL   HDL 47 (L) > OR = 50 mg/dL   Triglycerides 86 <150 mg/dL   LDL Cholesterol (Calc) 149 (H) mg/dL (calc)   Total CHOL/HDL Ratio 4.6 <5.0 (calc)   Non-HDL Cholesterol (Calc) 168 (H) <130 mg/dL (calc)  COMPLETE METABOLIC PANEL WITH GFR  Result Value Ref Range   Glucose, Bld 91 65 - 99 mg/dL   BUN 16 7 - 25 mg/dL   Creat 0.86 0.50 - 1.05 mg/dL   GFR, Est Non African American 78 > OR = 60 mL/min/1.15m2   GFR, Est African American 91 > OR = 60 mL/min/1.28m2   BUN/Creatinine Ratio NOT APPLICABLE 6 - 22 (calc)   Sodium 142 135 - 146 mmol/L   Potassium 4.2 3.5 - 5.3 mmol/L   Chloride 105 98 - 110 mmol/L   CO2 32 20 - 32 mmol/L   Calcium 10.0 8.6 - 10.4 mg/dL   Total Protein 7.1 6.1 - 8.1 g/dL   Albumin 4.5 3.6 - 5.1 g/dL   Globulin 2.6 1.9 - 3.7 g/dL (calc)   AG Ratio 1.7 1.0 - 2.5 (calc)   Total Bilirubin 0.6 0.2 - 1.2 mg/dL   Alkaline phosphatase (APISO) 84 37 - 153 U/L   AST 18 10 - 35 U/L   ALT 18 6 - 29 U/L  Hemoglobin A1c  Result Value Ref Range   Hgb A1c MFr Bld 5.6 <5.7 % of total Hgb   Mean Plasma Glucose 114 (calc)   eAG (mmol/L) 6.3 (calc)  CBC with Differential/Platelet  Result Value Ref Range   WBC 3.4 (L) 3.8 - 10.8 Thousand/uL   RBC 4.46 3.80 - 5.10 Million/uL   Hemoglobin 13.5 11.7 - 15.5 g/dL   HCT 40.9 35.0 - 45.0 %   MCV 91.7 80.0 - 100.0 fL   MCH 30.3 27.0 - 33.0 pg   MCHC 33.0 32.0 - 36.0 g/dL   RDW 12.8 11.0 - 15.0 %   Platelets 262 140 - 400 Thousand/uL   MPV 11.4 7.5 - 12.5 fL   Neutro Abs 1,829 1,500 - 7,800 cells/uL   Lymphs Abs 1,142  850 - 3,900  cells/uL   Absolute Monocytes 248 200 - 950 cells/uL   Eosinophils Absolute 139 15 - 500 cells/uL   Basophils Absolute 41 0 - 200 cells/uL   Neutrophils Relative % 53.8 %   Total Lymphocyte 33.6 %   Monocytes Relative 7.3 %   Eosinophils Relative 4.1 %   Basophils Relative 1.2 %      Assessment & Plan:   Problem List Items Addressed This Visit    None    Visit Diagnoses    Tachycardia    -  Primary   Relevant Orders   Ambulatory referral to Cardiology    Patient with tachycardia, likely SVT event on 9/15 with delayed cardiac workup.  No findings were significant for cause.  Patient with baseline anxiety, which could be contributing factor.  Also has history of variable HR in past.  Plan: 1. Referral cardiology - Consider extended holter monitor 2. Continue monitoring symptoms for recurrence, seek care at time of recurrence if feeling poorly. 3. Continue good stress management. 4. Follow-up prn.  I have reviewed the admission H&P, hospital notes, discharge summary, discharge medication list, and have reconciled the current and discharge medications today.  Follow up plan: Return if symptoms worsen or fail to improve.  A total of 36 minutes was spent face-to-face with this patient. Greater than 50% of this time was spent in counseling and coordination of care with the patient.    Wilhelmina McardleLauren Rainn Bullinger, DNP, AGPCNP-BC Adult Gerontology Primary Care Nurse Practitioner [facility] 07/21/2019, 2:58 PM

## 2019-07-23 ENCOUNTER — Ambulatory Visit (INDEPENDENT_AMBULATORY_CARE_PROVIDER_SITE_OTHER): Payer: BC Managed Care – PPO

## 2019-07-23 ENCOUNTER — Other Ambulatory Visit: Payer: Self-pay | Admitting: Nurse Practitioner

## 2019-07-23 ENCOUNTER — Encounter: Payer: Self-pay | Admitting: Cardiology

## 2019-07-23 ENCOUNTER — Other Ambulatory Visit: Payer: Self-pay

## 2019-07-23 ENCOUNTER — Ambulatory Visit (INDEPENDENT_AMBULATORY_CARE_PROVIDER_SITE_OTHER): Payer: BC Managed Care – PPO | Admitting: Cardiology

## 2019-07-23 VITALS — BP 122/82 | HR 65 | Ht 65.0 in | Wt 159.5 lb

## 2019-07-23 DIAGNOSIS — E7800 Pure hypercholesterolemia, unspecified: Secondary | ICD-10-CM | POA: Diagnosis not present

## 2019-07-23 DIAGNOSIS — Z1231 Encounter for screening mammogram for malignant neoplasm of breast: Secondary | ICD-10-CM

## 2019-07-23 DIAGNOSIS — R002 Palpitations: Secondary | ICD-10-CM

## 2019-07-23 DIAGNOSIS — R079 Chest pain, unspecified: Secondary | ICD-10-CM

## 2019-07-23 NOTE — Patient Instructions (Signed)
Medication Instructions:  Your physician recommends that you continue on your current medications as directed. Please refer to the Current Medication list given to you today.  If you need a refill on your cardiac medications before your next appointment, please call your pharmacy.   Lab work: NONE If you have labs (blood work) drawn today and your tests are completely normal, you will receive your results only by: Marland Kitchen MyChart Message (if you have MyChart) OR . A paper copy in the mail If you have any lab test that is abnormal or we need to change your treatment, we will call you to review the results.  Testing/Procedures: Your physician has recommended that you wear an 14 DAY ZIO event monitor. Event monitors are medical devices that record the heart's electrical activity. Doctors most often [place] these monitors to diagnose arrhythmias. Arrhythmias are problems with the speed or rhythm of the heartbeat. The monitor is a small, portable device. You can wear one while you do your normal daily activities. This is usually used to diagnose what is causing Palpitations/syncope (passing out). A Zio Patch Event Heart monitor will be applied to your chest today.  You will wear the patch for 14 days. After 24 hours, you may shower with the heart monitor on. If you feel any SYMPTOMS, you may press and release the button in the middle of the monitor.   Your physician has requested that you have an echocardiogram. Echocardiography is a painless test that uses sound waves to create images of your heart. It provides your doctor with information about the size and shape of your heart and how well your heart's chambers and valves are working. This procedure takes approximately one hour. There are no restrictions for this procedure. You may get an IV, if needed, to receive an ultrasound enhancing agent through to better visualize your heart.     Follow-Up: At [facility], you and your health needs are our  priority.  As part of our continuing mission to provide you with exceptional heart care, we have created designated Provider Care Teams.  These Care Teams include your primary Cardiologist (physician) and Advanced Practice Providers (APPs -  Physician Assistants and Nurse Practitioners) who all work together to provide you with the care you need, when you need it. You will need a follow up appointment in 6 weeks.  You may see Kate Sable, MD or one of the following Advanced Practice Providers on your designated Care Team:   Murray Hodgkins, NP Christell Faith, PA-C . Marrianne Mood, PA-C   Echocardiogram An echocardiogram is a procedure that uses painless sound waves (ultrasound) to produce an image of the heart. Images from an echocardiogram can provide important information about:  Signs of coronary artery disease (CAD).  Aneurysm detection. An aneurysm is a weak or damaged part of an artery wall that bulges out from the normal force of blood pumping through the body.  Heart size and shape. Changes in the size or shape of the heart can be associated with certain conditions, including heart failure, aneurysm, and CAD.  Heart muscle function.  Heart valve function.  Signs of a past heart attack.  Fluid buildup around the heart.  Thickening of the heart muscle.  A tumor or infectious growth around the heart valves. Tell a health care provider about:  Any allergies you have.  All medicines you are taking, including vitamins, herbs, eye drops, creams, and over-the-counter medicines.  Any blood disorders you have.  Any surgeries you have  had.  Any medical conditions you have.  Whether you are pregnant or may be pregnant. What are the risks? Generally, this is a safe procedure. However, problems may occur, including:  Allergic reaction to dye (contrast) that may be used during the procedure. What happens before the procedure? No specific preparation is needed. You may eat  and drink normally. What happens during the procedure?   An IV tube may be inserted into one of your veins.  You may receive contrast through this tube. A contrast is an injection that improves the quality of the pictures from your heart.  A gel will be applied to your chest.  A wand-like tool (transducer) will be moved over your chest. The gel will help to transmit the sound waves from the transducer.  The sound waves will harmlessly bounce off of your heart to allow the heart images to be captured in real-time motion. The images will be recorded on a computer. The procedure may vary among health care providers and hospitals. What happens after the procedure?  You may return to your normal, everyday life, including diet, activities, and medicines, unless your health care provider tells you not to do that. Summary  An echocardiogram is a procedure that uses painless sound waves (ultrasound) to produce an image of the heart.  Images from an echocardiogram can provide important information about the size and shape of your heart, heart muscle function, heart valve function, and fluid buildup around your heart.  You do not need to do anything to prepare before this procedure. You may eat and drink normally.  After the echocardiogram is completed, you may return to your normal, everyday life, unless your health care provider tells you not to do that. This information is not intended to replace advice given to you by your health care provider. Make sure you discuss any questions you have with your health care provider. Document Released: 09/28/2000 Document Revised: 01/22/2019 Document Reviewed: 11/03/2016 Elsevier Patient Education  2020 [employer].

## 2019-07-23 NOTE — Progress Notes (Signed)
Cardiology Office Note:    Date:  07/23/2019   ID:  Tanya Hill, DOB [birth year], MRN [id number]  PCP:  Galen Manila, NP  Cardiologist:  Debbe Odea, MD  Electrophysiologist:  None   Referring MD: Galen Manila, *   Chief Complaint  Patient presents with  . New Patient (Initial Visit)    referred by PCP for elevated HR. Patient c/o chest tightness and High HR. Meds reviewed verbally with patient.     History of Present Illness:    Tanya Hill is a 53 y.o. female with a hx of hyperlipidemia, hypertension, who presents due to palpitations.  Patient states having symptoms of palpitations about 3 years ago.  At the time he was going through a stressful divorce, and also in nursing school.  She states symptoms lasted couple of minutes and then stopped.  Her blood pressure at the time was slightly elevated in the 130s.  Her primary care doctor placed her on beta-blocker which she took for some time.  She has not had any symptoms since until about a month ago, while she was at work at [employer] she suddenly experienced symptoms of palpitations.  She told her charge nurse who checked her pulse with a pulse ox and recorded heart rates of about 200s.  Patient was then wheeled to the emergency department, but by the time she arrived her heart rates had normalized with heart rates in the 80s.  Apart from palpitations he denies shortness of breath, dizziness, syncope.  She noticed some chest discomfort and shortness of breath 3 days later while moving some chairs at home.  She was evaluated in the ED where work-up via pharmacologic stress myocardial perfusion imaging was normal.  Chest CTA to evaluate for PE was also normal.  Past Medical History:  Diagnosis Date  . Endometriosis   . 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 Hypertension 2017    Past Surgical History:  Procedure Laterality Date  . ABDOMINAL HYSTERECTOMY  2008  . CESAREAN SECTION  1990  .  LAPAROSCOPIC SALPINGO OOPHERECTOMY Right 02/25/2017   Procedure: LAPAROSCOPIC RIGHT SALPINGO OOPHORECTOMY;  Surgeon: Linzie Collin, MD;  Location: [facility] ORS;  Service: Gynecology;  Laterality: Right;  . LAPAROTOMY N/A 02/25/2017   Procedure: LAPAROTOMY;  Surgeon: Linzie Collin, MD;  Location: [facility] ORS;  Service: Gynecology;  Laterality: N/A;  . OOPHORECTOMY Left     Current Medications: Current Meds  Medication Sig  . atorvastatin (LIPITOR) 20 MG tablet Take 20 mg by mouth at bedtime.  . Cholecalciferol (VITAMIN D) 2000 units tablet Take 2,000 Units by mouth daily.  . fexofenadine (ALLEGRA) 180 MG tablet Take 180 mg by mouth daily.  . Multiple Vitamins-Minerals (MULTIVITAMIN WITH MINERALS) tablet Take 1 tablet by mouth daily.     Allergies:   Latex and Penicillins   Social History   Socioeconomic History  . Marital status: Divorced    Spouse name: Not on file  . Number of children: Not on file  . Years of education: Not on file  . Highest education level: Not on file  Occupational History  . Not on file  Social Needs  . Financial resource strain: Not on file  . Food insecurity    Worry: Not on file    Inability: Not on file  . Transportation needs    Medical: Not on file    Non-medical: Not on file  Tobacco Use  . Smoking status: Never Smoker  .  Smokeless tobacco: Never Used  Substance and Sexual Activity  . Alcohol use: No  . Drug use: No  . Sexual activity: Not Currently    Birth control/protection: None  Lifestyle  . Physical activity    Days per week: Not on file    Minutes per session: Not on file  . Stress: Not on file  Relationships  . Social Musician on phone: Not on file    Gets together: Not on file    Attends religious service: Not on file    Active member of club or organization: Not on file    Attends meetings of clubs or organizations: Not on file    Relationship status: Not on file  Other Topics Concern  . Not on file   Social History Narrative  . Not on file     Family History: The patient's family history includes Cancer in her father; Healthy in her brother; Hyperlipidemia in her father; Hypertension in her mother; Stroke in her mother.  ROS:   Please see the history of present illness.     All other systems reviewed and are negative.  EKGs/Labs/Other Studies Reviewed:    The following studies were reviewed today:   EKG:  EKG is  ordered today.  The ekg ordered today demonstrates normal sinus rhythm, normal ECG.  Recent Labs: 05/26/2019: ALT 18; BUN 16; Creat 0.86; Hemoglobin 13.5; Platelets 262; Potassium 4.2; Sodium 142  Recent Lipid Panel    Component Value Date/Time   CHOL 215 (H) 05/26/2019 1003   TRIG 86 05/26/2019 1003   HDL 47 (L) 05/26/2019 1003   CHOLHDL 4.6 05/26/2019 1003   LDLCALC 149 (H) 05/26/2019 1003    Physical Exam:    VS:  BP 122/82 (BP Location: Right Arm, Patient Position: Sitting, Cuff Size: Normal)   Pulse 65   Ht  (1.651 m)   Wt 159 lb 8 oz (72.3 kg)   BMI 26.54 kg/m     Wt Readings from Last 3 Encounters:  07/23/19 159 lb 8 oz (72.3 kg)  07/21/19 160 lb 3.2 oz (72.7 kg)  05/25/19 159 lb 9.6 oz (72.4 kg)     GEN:  Well nourished, well developed in no acute distress HEENT: Normal NECK: No JVD; No carotid bruits LYMPHATICS: No lymphadenopathy CARDIAC: RRR, no murmurs, rubs, gallops RESPIRATORY:  Clear to auscultation without rales, wheezing or rhonchi  ABDOMEN: Soft, non-tender, non-distended MUSCULOSKELETAL:  No edema; No deformity  SKIN: Warm and dry NEUROLOGIC:  Alert and oriented x 3 PSYCHIATRIC:  Normal affect   ASSESSMENT:   Patient with rare episodes of palpitations.  With heart rates up to 200s and no symptoms of dizziness or syncope she probably had an SVT.  Again it is difficult to be certain for sure. 1. Palpitation   2. Chest pain of uncertain etiology   3. Pure hypercholesterolemia    PLAN:    In order of problems listed  above:  1. ZIO Patch x2 weeks.  Get echocardiogram to rule out any structural abnormalities. 2. Stress myocardial perfusion imaging at outside facility was normal.  We will try to obtain these records to confirm.  Get echocardiogram as above. 3. Continue Lipitor  Follow-up in 4 to 6 weeks.   Medication Adjustments/Labs and Tests Ordered: Current medicines are reviewed at length with the patient today.  Concerns regarding medicines are outlined above.  Orders Placed This Encounter  Procedures  . LONG TERM MONITOR (3-14 DAYS)  . EKG  12-Lead  . ECHOCARDIOGRAM COMPLETE   No orders of the defined types were placed in this encounter.   Patient Instructions  Medication Instructions:  Your physician recommends that you continue on your current medications as directed. Please refer to the Current Medication list given to you today.  If you need a refill on your cardiac medications before your next appointment, please call your pharmacy.   Lab work: NONE If you have labs (blood work) drawn today and your tests are completely normal, you will receive your results only by: Marland Kitchen MyChart Message (if you have MyChart) OR . A paper copy in the mail If you have any lab test that is abnormal or we need to change your treatment, we will call you to review the results.  Testing/Procedures: Your physician has recommended that you wear an 14 DAY ZIO event monitor. Event monitors are medical devices that record the heart's electrical activity. Doctors most often [place] these monitors to diagnose arrhythmias. Arrhythmias are problems with the speed or rhythm of the heartbeat. The monitor is a small, portable device. You can wear one while you do your normal daily activities. This is usually used to diagnose what is causing Palpitations/syncope (passing out). A Zio Patch Event Heart monitor will be applied to your chest today.  You will wear the patch for 14 days. After 24 hours, you may shower with the heart  monitor on. If you feel any SYMPTOMS, you may press and release the button in the middle of the monitor.   Your physician has requested that you have an echocardiogram. Echocardiography is a painless test that uses sound waves to create images of your heart. It provides your doctor with information about the size and shape of your heart and how well your heart's chambers and valves are working. This procedure takes approximately one hour. There are no restrictions for this procedure. You may get an IV, if needed, to receive an ultrasound enhancing agent through to better visualize your heart.     Follow-Up: At [facility], you and your health needs are our priority.  As part of our continuing mission to provide you with exceptional heart care, we have created designated Provider Care Teams.  These Care Teams include your primary Cardiologist (physician) and Advanced Practice Providers (APPs -  Physician Assistants and Nurse Practitioners) who all work together to provide you with the care you need, when you need it. You will need a follow up appointment in 6 weeks.  You may see Kate Sable, MD or one of the following Advanced Practice Providers on your designated Care Team:   Murray Hodgkins, NP Christell Faith, PA-C . Marrianne Mood, PA-C   Echocardiogram An echocardiogram is a procedure that uses painless sound waves (ultrasound) to produce an image of the heart. Images from an echocardiogram can provide important information about:  Signs of coronary artery disease (CAD).  Aneurysm detection. An aneurysm is a weak or damaged part of an artery wall that bulges out from the normal force of blood pumping through the body.  Heart size and shape. Changes in the size or shape of the heart can be associated with certain conditions, including heart failure, aneurysm, and CAD.  Heart muscle function.  Heart valve function.  Signs of a past heart attack.  Fluid buildup around the  heart.  Thickening of the heart muscle.  A tumor or infectious growth around the heart valves. Tell a health care provider about:  Any allergies you have.  All  medicines you are taking, including vitamins, herbs, eye drops, creams, and over-the-counter medicines.  Any blood disorders you have.  Any surgeries you have had.  Any medical conditions you have.  Whether you are pregnant or may be pregnant. What are the risks? Generally, this is a safe procedure. However, problems may occur, including:  Allergic reaction to dye (contrast) that may be used during the procedure. What happens before the procedure? No specific preparation is needed. You may eat and drink normally. What happens during the procedure?   An IV tube may be inserted into one of your veins.  You may receive contrast through this tube. A contrast is an injection that improves the quality of the pictures from your heart.  A gel will be applied to your chest.  A wand-like tool (transducer) will be moved over your chest. The gel will help to transmit the sound waves from the transducer.  The sound waves will harmlessly bounce off of your heart to allow the heart images to be captured in real-time motion. The images will be recorded on a computer. The procedure may vary among health care providers and hospitals. What happens after the procedure?  You may return to your normal, everyday life, including diet, activities, and medicines, unless your health care provider tells you not to do that. Summary  An echocardiogram is a procedure that uses painless sound waves (ultrasound) to produce an image of the heart.  Images from an echocardiogram can provide important information about the size and shape of your heart, heart muscle function, heart valve function, and fluid buildup around your heart.  You do not need to do anything to prepare before this procedure. You may eat and drink normally.  After the  echocardiogram is completed, you may return to your normal, everyday life, unless your health care provider tells you not to do that. This information is not intended to replace advice given to you by your health care provider. Make sure you discuss any questions you have with your health care provider. Document Released: 09/28/2000 Document Revised: 01/22/2019 Document Reviewed: 11/03/2016 Elsevier Patient Education  2020 [employer]        Signed, Debbe OdeaBrian Agbor-Etang, MD  07/23/2019 9:23 AM    [facility]

## 2019-07-28 ENCOUNTER — Encounter: Payer: Self-pay | Admitting: Nurse Practitioner

## 2019-08-03 ENCOUNTER — Telehealth: Payer: Self-pay | Admitting: Gastroenterology

## 2019-08-03 NOTE — Telephone Encounter (Signed)
Pt is  Calling to cancel her procedure she does not want to r/s at this time due to work

## 2019-08-03 NOTE — Telephone Encounter (Signed)
Colonoscopy has been canceled per patients requested for 09/30/19 with Dr. Marius Ditch.  Patient does not wish to reschedule now due to work. Trish in Endo has been informed of cancellation.  Thanks [employer]

## 2019-08-14 ENCOUNTER — Encounter: Payer: Self-pay | Admitting: Physician Assistant

## 2019-08-14 ENCOUNTER — Ambulatory Visit (INDEPENDENT_AMBULATORY_CARE_PROVIDER_SITE_OTHER): Payer: BC Managed Care – PPO | Admitting: Physician Assistant

## 2019-08-14 ENCOUNTER — Other Ambulatory Visit: Payer: Self-pay

## 2019-08-14 VITALS — BP 116/79 | HR 88 | Temp 98.8°F

## 2019-08-14 DIAGNOSIS — J40 Bronchitis, not specified as acute or chronic: Secondary | ICD-10-CM

## 2019-08-14 MED ORDER — ALBUTEROL SULFATE HFA 108 (90 BASE) MCG/ACT IN AERS: 2.0000 | INHALATION_SPRAY | Freq: Four times a day (QID) | RESPIRATORY_TRACT | 0 refills | Status: DC | PRN

## 2019-08-14 MED ORDER — PREDNISONE 20 MG PO TABS: 20.0000 mg | ORAL_TABLET | Freq: Every day | ORAL | 0 refills | Status: AC

## 2019-08-14 NOTE — Progress Notes (Signed)
Subjective:    Patient ID: Tanya Hill, female    DOB: 1965-11-29, 53 y.o.   MRN: [id number]  Tanya Hill is a 53 y.o. female presenting on 08/14/2019 for Sinus Problem (sore throat, nasal drainage, post nasal drainage, runny nose, wheezing and coughing x 2 days )  Virtual Visit via Telephone Note  I connected with Herb Grays on 08/14/19 at  3:40 PM EDT by telephone and verified that I am speaking with the correct person using two identifiers.   I discussed the limitations, risks, security and privacy concerns of performing an evaluation and management service by telephone and the availability of in person appointments. I also discussed with the patient that there may be a patient responsible charge related to this service. The patient expressed understanding and agreed to proceed.   Patient location: home Provider location: [facility]/home office  Persons involved in the visit: patient, provider    HPI   Patient with no contributory past medical history presents with sinus congestion x 3 days, sore throat, nasal drainage. She has been taking Mucinex max which has loosened up some. She reports after she gets this issue and it clears up and she gets wheezing. Reports voice is hoarse. She reports her concern is mainly wheezing. She denies fever. She is not currently allegra. No sudafed or decongestant. She has cough pearls. No contact positive COVID. Denies facial pain, SOB.   Work at [employer].   Social History   Tobacco Use  . Smoking status: Never Smoker  . Smokeless tobacco: Never Used  Substance Use Topics  . Alcohol use: No  . Drug use: No    Review of Systems Per HPI unless specifically indicated above     Objective:    BP 116/79   Pulse 88   Temp 98.8 F (37.1 C) (Oral)   Wt Readings from Last 3 Encounters:  07/23/19 159 lb 8 oz (72.3 kg)  07/21/19 160 lb 3.2 oz (72.7 kg)  05/25/19 159 lb 9.6 oz (72.4 kg)     Physical Exam Results for orders placed or performed in visit on 05/25/19  TSH + free T4  Result Value Ref Range   TSH W/REFLEX TO FT4 1.04 mIU/L  Lipid panel  Result Value Ref Range   Cholesterol 215 (H) <200 mg/dL   HDL 47 (L) > OR = 50 mg/dL   Triglycerides 86 <834 mg/dL   LDL Cholesterol (Calc) 149 (H) mg/dL (calc)   Total CHOL/HDL Ratio 4.6 <5.0 (calc)   Non-HDL Cholesterol (Calc) 168 (H) <130 mg/dL (calc)  COMPLETE METABOLIC PANEL WITH GFR  Result Value Ref Range   Glucose, Bld 91 65 - 99 mg/dL   BUN 16 7 - 25 mg/dL   Creat 1.96 2.22 - 9.79 mg/dL   GFR, Est Non African American 78 > OR = 60 mL/min/1.70m2   GFR, Est African American 91 > OR = 60 mL/min/1.61m2   BUN/Creatinine Ratio NOT APPLICABLE 6 - 22 (calc)   Sodium 142 135 - 146 mmol/L   Potassium 4.2 3.5 - 5.3 mmol/L   Chloride 105 98 - 110 mmol/L   CO2 32 20 - 32 mmol/L   Calcium 10.0 8.6 - 10.4 mg/dL   Total Protein 7.1 6.1 - 8.1 g/dL   Albumin 4.5 3.6 - 5.1 g/dL   Globulin 2.6 1.9 - 3.7 g/dL (calc)   AG Ratio 1.7 1.0 - 2.5 (calc)   Total Bilirubin 0.6 0.2 - 1.2 mg/dL  Alkaline phosphatase (APISO) 84 37 - 153 U/L   AST 18 10 - 35 U/L   ALT 18 6 - 29 U/L  Hemoglobin A1c  Result Value Ref Range   Hgb A1c MFr Bld 5.6 <5.7 % of total Hgb   Mean Plasma Glucose 114 (calc)   eAG (mmol/L) 6.3 (calc)  CBC with Differential/Platelet  Result Value Ref Range   WBC 3.4 (L) 3.8 - 10.8 Thousand/uL   RBC 4.46 3.80 - 5.10 Million/uL   Hemoglobin 13.5 11.7 - 15.5 g/dL   HCT 40.9 35.0 - 45.0 %   MCV 91.7 80.0 - 100.0 fL   MCH 30.3 27.0 - 33.0 pg   MCHC 33.0 32.0 - 36.0 g/dL   RDW 12.8 11.0 - 15.0 %   Platelets 262 140 - 400 Thousand/uL   MPV 11.4 7.5 - 12.5 fL   Neutro Abs 1,829 1,500 - 7,800 cells/uL   Lymphs Abs 1,142 850 - 3,900 cells/uL   Absolute Monocytes 248 200 - 950 cells/uL   Eosinophils Absolute 139 15 - 500 cells/uL   Basophils Absolute 41 0 - 200 cells/uL   Neutrophils Relative % 53.8 %   Total  Lymphocyte 33.6 %   Monocytes Relative 7.3 %   Eosinophils Relative 4.1 %   Basophils Relative 1.2 %      Assessment & Plan:  1. Bronchitis  Likely combination of bronchitis 2/2 virus and allergies. Recommended she get COVID tested which as she works with [employer] she should be able to do more easily with her employer. Will give prednisone burst and inhaler as below. Counseled on return precautions in 10 days at which point antibiotic would be more appropriate for bacterial sinusitis.   - predniSONE (DELTASONE) 20 MG tablet; Take 1 tablet (20 mg total) by mouth daily with breakfast for 5 days.  Dispense: 5 tablet; Refill: 0 - albuterol (VENTOLIN HFA) 108 (90 Base) MCG/ACT inhaler; Inhale 2 puffs into the lungs every 6 (six) hours as needed for wheezing or shortness of breath.  Dispense: 6.7 g; Refill: 0    Follow up plan: No follow-ups on file.  Carles Collet, PA-C [facility] 08/14/2019, 3:24 PM

## 2019-08-14 NOTE — Patient Instructions (Signed)
Acute Bronchitis, Adult Acute bronchitis is when air tubes (bronchi) in the lungs suddenly get swollen. The condition can make it hard to breathe. It can also cause these symptoms:  A cough.  Coughing up clear, yellow, or green mucus.  Wheezing.  Chest congestion.  Shortness of breath.  A fever.  Body aches.  Chills.  A sore throat. Follow these instructions at home:  Medicines  Take over-the-counter and prescription medicines only as told by your doctor.  If you were prescribed an antibiotic medicine, take it as told by your doctor. Do not stop taking the antibiotic even if you start to feel better. General instructions  Rest.  Drink enough fluids to keep your pee (urine) pale yellow.  Avoid smoking and secondhand smoke. If you smoke and you need help quitting, ask your doctor. Quitting will help your lungs heal faster.  Use an inhaler, cool mist vaporizer, or humidifier as told by your doctor.  Keep all follow-up visits as told by your doctor. This is important. How is this prevented? To lower your risk of getting this condition again:  Wash your hands often with soap and water. If you cannot use soap and water, use hand sanitizer.  Avoid contact with people who have cold symptoms.  Try not to touch your hands to your mouth, nose, or eyes.  Make sure to get the flu shot every year. Contact a doctor if:  Your symptoms do not get better in 2 weeks. Get help right away if:  You cough up blood.  You have chest pain.  You have very bad shortness of breath.  You become dehydrated.  You faint (pass out) or keep feeling like you are going to pass out.  You keep throwing up (vomiting).  You have a very bad headache.  Your fever or chills gets worse. This information is not intended to replace advice given to you by your health care provider. Make sure you discuss any questions you have with your health care provider. Document Released: 03/19/2008 Document  Revised: 09/13/2017 Document Reviewed: 03/21/2016 Elsevier Patient Education  2020 Elsevier Inc.  

## 2019-08-27 ENCOUNTER — Telehealth: Payer: Self-pay | Admitting: Nurse Practitioner

## 2019-08-27 ENCOUNTER — Other Ambulatory Visit: Payer: Self-pay

## 2019-08-27 ENCOUNTER — Ambulatory Visit (INDEPENDENT_AMBULATORY_CARE_PROVIDER_SITE_OTHER): Payer: BC Managed Care – PPO

## 2019-08-27 DIAGNOSIS — R079 Chest pain, unspecified: Secondary | ICD-10-CM

## 2019-08-27 DIAGNOSIS — E782 Mixed hyperlipidemia: Secondary | ICD-10-CM | POA: Insufficient documentation

## 2019-08-27 MED ORDER — ATORVASTATIN CALCIUM 20 MG PO TABS: 20.0000 mg | ORAL_TABLET | Freq: Every day | ORAL | 1 refills | Status: DC

## 2019-08-27 NOTE — Telephone Encounter (Signed)
Pt called requesting refill on Lipitor.

## 2019-09-02 ENCOUNTER — Other Ambulatory Visit: Payer: Self-pay

## 2019-09-02 ENCOUNTER — Inpatient Hospital Stay
Admission: RE | Admit: 2019-09-02 | Discharge: 2019-09-02 | Disposition: A | Discharge status: Home / Self Care | Payer: Self-pay | Source: Ambulatory Visit | Attending: *Deleted | Admitting: *Deleted

## 2019-09-02 ENCOUNTER — Other Ambulatory Visit: Payer: Self-pay | Admitting: *Deleted

## 2019-09-02 ENCOUNTER — Ambulatory Visit
Admission: RE | Admit: 2019-09-02 | Discharge: 2019-09-02 | Disposition: A | Discharge status: Home / Self Care | Payer: BC Managed Care – PPO | Source: Ambulatory Visit | Attending: Nurse Practitioner | Admitting: Nurse Practitioner

## 2019-09-02 DIAGNOSIS — Z1231 Encounter for screening mammogram for malignant neoplasm of breast: Secondary | ICD-10-CM

## 2019-09-02 DIAGNOSIS — Z13820 Encounter for screening for osteoporosis: Secondary | ICD-10-CM | POA: Diagnosis present

## 2019-09-03 ENCOUNTER — Encounter: Payer: Self-pay | Admitting: Cardiology

## 2019-09-03 ENCOUNTER — Ambulatory Visit (INDEPENDENT_AMBULATORY_CARE_PROVIDER_SITE_OTHER): Payer: BC Managed Care – PPO | Admitting: Cardiology

## 2019-09-03 VITALS — BP 124/90 | HR 75 | Ht 65.5 in | Wt 158.8 lb

## 2019-09-03 DIAGNOSIS — I471 Supraventricular tachycardia: Secondary | ICD-10-CM | POA: Diagnosis not present

## 2019-09-03 MED ORDER — METOPROLOL TARTRATE 25 MG PO TABS: 25.0000 mg | ORAL_TABLET | Freq: Two times a day (BID) | ORAL | 3 refills | Status: DC

## 2019-09-03 NOTE — Patient Instructions (Signed)
Medication Instructions:  Your physician has recommended you make the following change in your medication:  1. START Metoprolol 25 mg twice a day  *If you need a refill on your cardiac medications before your next appointment, please call your pharmacy*  Lab Work: None If you have labs (blood work) drawn today and your tests are completely normal, you will receive your results only by: Marland Kitchen MyChart Message (if you have MyChart) OR . A paper copy in the mail If you have any lab test that is abnormal or we need to change your treatment, we will call you to review the results.  Testing/Procedures: None  Follow-Up: At [facility], you and your health needs are our priority.  As part of our continuing mission to provide you with exceptional heart care, we have created designated Provider Care Teams.  These Care Teams include your primary Cardiologist (physician) and Advanced Practice Providers (APPs -  Physician Assistants and Nurse Practitioners) who all work together to provide you with the care you need, when you need it.  Your next appointment:   Follow up with Dr. Garen Lah after you see electrophysiology physician   The format for your next appointment:   In Person  Provider:   Kate Sable, MD  Other Instructions You have been referred to see Electrophysiology for your SVT.

## 2019-09-03 NOTE — Progress Notes (Signed)
Cardiology Office Note:    Date:  09/03/2019   ID:  Tanya Hill, DOB 10/13/1966, MRN [id number]  PCP:  Galen ManilaKennedy, Lauren Renee, NP (Inactive)  Cardiologist:  Debbe OdeaBrian Agbor-Etang, MD  Electrophysiologist:  None   Referring MD: Galen ManilaKennedy, Lauren Renee, *   Chief Complaint  Patient presents with  . other    6 wk f/u zio and echo no complaints today. Meds reviewed verbally with pt.    History of Present Illness:    Tanya Hill is a 53 y.o. female with a hx of hyperlipidemia, hypertension, who presents for a 6-week follow-up.  Patient was originally seen due to palpitations.   During last visit, patient states having symptoms of palpitations about 3 years ago.  At the time she was going through a stressful divorce, and also in nursing school.  She states symptoms lasted couple of minutes and then stopped.  Her blood pressure at the time was slightly elevated in the 130s.  Her primary care doctor placed her on beta-blocker which she took for some time.  She has not had any symptoms since until about a month ago, while she was at work at [employer] and suddenly experienced symptoms of palpitations.  She told her charge nurse who checked her pulse with a pulse ox and recorded heart rates of about 200s.  Patient was then wheeled to the emergency department, but by the time she arrived her heart rates had normalized with heart rates in the 80s.  Apart from palpitations he denies shortness of breath, dizziness, syncope.  She noticed some chest discomfort and shortness of breath 3 days later while moving some chairs at home.  She was evaluated in the ED where work-up via pharmacologic stress myocardial perfusion imaging was normal.  Chest CTA to evaluate for PE was also normal.  After last visit, an echocardiogram and cardiac monitor was ordered.  Past Medical History:  Diagnosis Date  . Endometriosis   . 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 Hyperlipidemia   . Hypertension 2017     Past Surgical History:  Procedure Laterality Date  . ABDOMINAL HYSTERECTOMY  2008  . CESAREAN SECTION  1990  . LAPAROSCOPIC SALPINGO OOPHERECTOMY Right 02/25/2017   Procedure: LAPAROSCOPIC RIGHT SALPINGO OOPHORECTOMY;  Surgeon: Linzie CollinEvans, David James, MD;  Location: [facility] ORS;  Service: Gynecology;  Laterality: Right;  . LAPAROTOMY N/A 02/25/2017   Procedure: LAPAROTOMY;  Surgeon: Linzie CollinEvans, David James, MD;  Location: [facility] ORS;  Service: Gynecology;  Laterality: N/A;  . OOPHORECTOMY Left     Current Medications: Current Meds  Medication Sig  . atorvastatin (LIPITOR) 20 MG tablet Take 1 tablet (20 mg total) by mouth at bedtime.  . Cholecalciferol (VITAMIN D) 2000 units tablet Take 2,000 Units by mouth daily.  . fexofenadine (ALLEGRA) 180 MG tablet Take 180 mg by mouth daily.  . Multiple Vitamins-Minerals (MULTIVITAMIN WITH MINERALS) tablet Take 1 tablet by mouth daily.  Marland Kitchen. Phenylephrine-DM-GG-APAP (MUCINEX SINUS-MAX) 5-10-200-325 MG CAPS Take by mouth.     Allergies:   Latex and Penicillins   Social History   Socioeconomic History  . Marital status: Divorced    Spouse name: Not on file  . Number of children: Not on file  . Years of education: Not on file  . Highest education level: Not on file  Occupational History  . Not on file  Social Needs  . Financial resource strain: Not on file  . Food insecurity    Worry: Not on file  Inability: Not on file  . Transportation needs    Medical: Not on file    Non-medical: Not on file  Tobacco Use  . Smoking status: Never Smoker  . Smokeless tobacco: Never Used  Substance and Sexual Activity  . Alcohol use: No  . Drug use: No  . Sexual activity: Not Currently    Birth control/protection: None  Lifestyle  . Physical activity    Days per week: Not on file    Minutes per session: Not on file  . Stress: Not on file  Relationships  . Social Musician on phone: Not on file    Gets together: Not on file    Attends  religious service: Not on file    Active member of club or organization: Not on file    Attends meetings of clubs or organizations: Not on file    Relationship status: Not on file  Other Topics Concern  . Not on file  Social History Narrative  . Not on file     Family History: The patient's family history includes Breast cancer in her cousin, paternal aunt, and another family member; Cancer in her father; Healthy in her brother; Hyperlipidemia in her father; Hypertension in her mother; Stroke in her mother.  ROS:   Please see the history of present illness.     All other systems reviewed and are negative.  EKGs/Labs/Other Studies Reviewed:    The following studies were reviewed today: TTE 08-29-2019 1. Left ventricular ejection fraction, by visual estimation, is 60 to 65%. The left ventricle has normal function. There is no left ventricular hypertrophy.  2. Global right ventricle has normal systolic function.The right ventricular size is normal. No increase in right ventricular wall thickness.  3. Left atrial size was normal.  4. There is mild dilatation of the aortic root measuring 37 mm.  5. Normal pulmonary artery systolic pressure.  2-week cardiac monitor date 07/23/2019 Patient had a min HR of 47 bpm, max HR of 197 bpm, and avg HR of 77 bpm. Predominant underlying rhythm was Sinus Rhythm. Slight P wave morphology changes were noted. 4 Supraventricular Tachycardia runs occurred, the run with the fastest interval lasting 8.5 secs with a max rate of 197 bpm, the longest lasting 18 beats with an avg rate of 112 bpm. Supraventricular Tachycardia was detected within +/- 45 seconds of symptomatic patient event(s). Isolated SVEs were rare (<1.0%), SVE Couplets were rare (<1.0%), and SVE Triplets were rare (<1.0%). Isolated VEs were rare (<1.0%), VE Couplets were rare (<1.0%)  EKG:  EKG is  ordered today.  The ekg ordered today demonstrates normal sinus rhythm, normal ECG.  Recent  Labs: 05/26/2019: ALT 18; BUN 16; Creat 0.86; Hemoglobin 13.5; Platelets 262; Potassium 4.2; Sodium 142  Recent Lipid Panel    Component Value Date/Time   CHOL 215 (H) 05/26/2019 1003   TRIG 86 05/26/2019 1003   HDL 47 (L) 05/26/2019 1003   CHOLHDL 4.6 05/26/2019 1003   LDLCALC 149 (H) 05/26/2019 1003    Physical Exam:    VS:  BP 124/90 (BP Location: Left Arm, Patient Position: Sitting, Cuff Size: Normal)   Pulse 75   Ht 5' 5.5" (1.664 m)   Wt 158 lb 12 oz (72 kg)   SpO2 99%   BMI 26.02 kg/m     Wt Readings from Last 3 Encounters:  09/03/19 158 lb 12 oz (72 kg)  07/23/19 159 lb 8 oz (72.3 kg)  07/21/19 160 lb 3.2  oz (72.7 kg)     GEN:  Well nourished, well developed in no acute distress HEENT: Normal NECK: No JVD; No carotid bruits LYMPHATICS: No lymphadenopathy CARDIAC: RRR, no murmurs, rubs, gallops RESPIRATORY:  Clear to auscultation without rales, wheezing or rhonchi  ABDOMEN: Soft, non-tender, non-distended MUSCULOSKELETAL:  No edema; No deformity  SKIN: Warm and dry NEUROLOGIC:  Alert and oriented x 3 PSYCHIATRIC:  Normal affect   ASSESSMENT:   Patient with rare episodes of palpitations.  2-week cardiac monitor showed 4 episodes of SVTs, fastest interval lasting 8.5 seconds with a maximum rate of 197 bpm.  The longest lasting SVT was 18 beats.  Echocardiogram was otherwise normal with no structural abnormalities. 1. SVT (supraventricular tachycardia) (HCC)    PLAN:    In order of problems listed above:  We will start patient on Lopressor 25 mg twice daily.   Will refer patient to see EP for any additional input..  Total encounter time more than 40 minutes  Greater than 50% was spent in counseling and coordination of care with the patient  Medication Adjustments/Labs and Tests Ordered: Current medicines are reviewed at length with the patient today.  Concerns regarding medicines are outlined above.  No orders of the defined types were placed in this  encounter.  No orders of the defined types were placed in this encounter.   There are no Patient Instructions on file for this visit.   Signed, Kate Sable, MD  09/03/2019 8:32 AM    [facility]

## 2019-09-07 NOTE — Progress Notes (Signed)
ELECTROPHYSIOLOGY CONSULT NOTE  Patient ID: Tanya Hill, MRN: [id number], DOB/AGE: September 22, 1966 54 y.o. Admit date: (Not on file) Date of Consult: 09/08/2019  Primary Physician: Tanya Manila, NP (Inactive) Primary Cardiologist: [facility]     Tanya Hill is a 53 y.o. female who is being seen today for the evaluation of SVT at the request of [facility].    HPI [name] is a 53 y.o. female referred because of an episode of tachycardia; she had recently been transferred from [facility] to [facility] and within the middle of the night shift.  She felt the abrupt onset of her heart going fast and then with a sensation that it would go faster.  It was associated with some fogginess of thought, perhaps lightheadedness.  Also mild shortness of breath but neither precluded her ability to continue the vital signs on her hall  She checked her pulse on her oximeter; it was about 200 bpm.  She finished doing her work on her home and then asked her charge nurse to check it again; confirming 200 bpm she was taken to the ER.  By the time she arrived there the symptoms had abated.    She had an episode that was quite similar about 3 years ago, briefer, just minutes.  Heart rate was in the 130s.  She has dyspnea on exertion accompanied by chest tightness unassociated with radiation.  Is relieved by rest.  Over the last 6 months.  Family history of coronary disease.  Does not smoke.  She was seen by Dr. Corrin Parker.  DATE TEST EF   9/20 MYOVIEW (RH)    % No perfusion data  11/20 Echo   60-65 %           Event Recorder personnally reviewed  >> nonsustained atrial tach>> 187 bpm   Echo >> normal LV function    Past Medical History:  Diagnosis Date  . Endometriosis   . 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 Hyperlipidemia   . Hypertension 2017      Surgical History:  Past Surgical History:  Procedure Laterality Date  . ABDOMINAL HYSTERECTOMY  2008  .  CESAREAN SECTION  1990  . LAPAROSCOPIC SALPINGO OOPHERECTOMY Right 02/25/2017   Procedure: LAPAROSCOPIC RIGHT SALPINGO OOPHORECTOMY;  Surgeon: Linzie Collin, MD;  Location: [facility] ORS;  Service: Gynecology;  Laterality: Right;  . LAPAROTOMY N/A 02/25/2017   Procedure: LAPAROTOMY;  Surgeon: Linzie Collin, MD;  Location: [facility] ORS;  Service: Gynecology;  Laterality: N/A;  . OOPHORECTOMY Left      Home Meds: Current Meds  Medication Sig  . atorvastatin (LIPITOR) 20 MG tablet Take 1 tablet (20 mg total) by mouth at bedtime.  . Cholecalciferol (VITAMIN D) 2000 units tablet Take 2,000 Units by mouth daily.  . fexofenadine (ALLEGRA) 180 MG tablet Take 180 mg by mouth daily.  . Multiple Vitamins-Minerals (MULTIVITAMIN WITH MINERALS) tablet Take 1 tablet by mouth daily.  . [DISCONTINUED] metoprolol tartrate (LOPRESSOR) 25 MG tablet Take 1 tablet (25 mg total) by mouth 2 (two) times daily.    Allergies:  Allergies  Allergen Reactions  . Latex Hives  . Penicillins Rash    Has patient had a PCN reaction causing immediate rash, facial/tongue/throat swelling, SOB or lightheadedness with hypotension: yes Has patient had a PCN reaction causing severe rash involving mucus membranes or skin necrosis: no Has patient had a PCN reaction that required hospitalization no Has patient had  a PCN reaction occurring within the last 10 years: no If all of the above answers are "NO", then may proceed with Cephalosporin use.     Social History   Socioeconomic History  . Marital status: Divorced    Spouse name: Not on file  . Number of children: Not on file  . Years of education: Not on file  . Highest education level: Not on file  Occupational History  . Not on file  Social Needs  . Financial resource strain: Not on file  . Food insecurity    Worry: Not on file    Inability: Not on file  . Transportation needs    Medical: Not on file    Non-medical: Not on file  Tobacco Use  . Smoking status:  Never Smoker  . Smokeless tobacco: Never Used  Substance and Sexual Activity  . Alcohol use: No  . Drug use: No  . Sexual activity: Not Currently    Birth control/protection: None  Lifestyle  . Physical activity    Days per week: Not on file    Minutes per session: Not on file  . Stress: Not on file  Relationships  . Social Herbalist on phone: Not on file    Gets together: Not on file    Attends religious service: Not on file    Active member of club or organization: Not on file    Attends meetings of clubs or organizations: Not on file    Relationship status: Not on file  . Intimate partner violence    Fear of current or ex partner: Not on file    Emotionally abused: Not on file    Physically abused: Not on file    Forced sexual activity: Not on file  Other Topics Concern  . Not on file  Social History Narrative  . Not on file     Family History  Problem Relation Age of Onset  . Hypertension Mother   . Stroke Mother   . Cancer Father        Kidney, prostate  . Hyperlipidemia Father   . Healthy Brother   . Breast cancer Paternal Aunt   . Breast cancer Cousin   . Breast cancer Other      ROS:  Please see the history of present illness.     All other systems reviewed and negative.    Physical Exam: Blood pressure 110/70, pulse 62, height 5' 5.5" (1.664 m), weight 158 lb 4 oz (71.8 kg). General: Well developed, well nourished female in no acute distress. Head: Normocephalic, atraumatic, sclera non-icteric, no xanthomas, nares are without discharge. EENT: normal  Lymph Nodes:  none Neck: Negative for carotid bruits. JVD not elevated. Back:without scoliosis kyphosis Lungs: Clear bilaterally to auscultation without wheezes, rales, or rhonchi. Breathing is unlabored. Heart: RRR with S1 S2. No  murmur . No rubs, or gallops appreciated. Abdomen: Soft, non-tender, non-distended with normoactive bowel sounds. No hepatomegaly. No rebound/guarding. No obvious  abdominal masses. Msk:  Strength and tone appear normal for age. Extremities: No clubbing or cyanosis. No  edema.  Distal pedal pulses are 2+ and equal bilaterally. Skin: Warm and Dry Neuro: Alert and oriented X 3. CN III-XII intact Grossly normal sensory and motor function . Psych:  Responds to questions appropriately with a normal affect.      Labs: Cardiac Enzymes No results for input(s): CKTOTAL, CKMB, TROPONINI in the last 72 hours. CBC Lab Results  Component Value Date  WBC 3.4 (L) 05/26/2019   HGB 13.5 05/26/2019   HCT 40.9 05/26/2019   MCV 91.7 05/26/2019   PLT 262 05/26/2019   PROTIME: No results for input(s): LABPROT, INR in the last 72 hours. Chemistry No results for input(s): NA, K, CL, CO2, BUN, CREATININE, CALCIUM, PROT, BILITOT, ALKPHOS, ALT, AST, GLUCOSE in the last 168 hours.  Invalid input(s): LABALBU Lipids Lab Results  Component Value Date   CHOL 215 (H) 05/26/2019   HDL 47 (L) 05/26/2019   LDLCALC 149 (H) 05/26/2019   TRIG 86 05/26/2019   BNP No results found for: PROBNP Thyroid Function Tests: No results for input(s): TSH, T4TOTAL, T3FREE, THYROIDAB in the last 72 hours.  Invalid input(s): FREET3 Miscellaneous No results found for: DDIMER  Radiology/Studies:    EKG: Sinus rhythm at 62 17/08/44 Septal infarct unchanged 10/20  LDL 149  HDL 47  TC 215   Assessment and Plan:  Atrial tachycardia  Dyspnea on exertion  Exertional chest pain question angina  Abnormal ECG suggestive of prior MI   We discussed the mechanism of her tachycardia.  For now we will continue her on metoprolol.  We broached the subject of antiarrhythmic therapy for worsening symptoms but she seemed to tolerate these episodes quite well.  Her biggest fear was that they were life-threatening.  We have reassured her on this.  I am also concerned about her dyspnea accompanied by chest discomfort.  Her ECG suggest prior septal MI.  We will undertake coronary CTA  scanning  At this point we will maintain her on her statins pending the results of the coronary CTA.  Otherwise, her 10-year risk is 1.3% which would stratify her for low risk and not need a statin. Sherryl MangesSteven Klein

## 2019-09-08 ENCOUNTER — Ambulatory Visit (INDEPENDENT_AMBULATORY_CARE_PROVIDER_SITE_OTHER): Payer: BC Managed Care – PPO | Admitting: Internal Medicine

## 2019-09-08 ENCOUNTER — Encounter: Payer: Self-pay | Admitting: Internal Medicine

## 2019-09-08 ENCOUNTER — Other Ambulatory Visit: Payer: Self-pay

## 2019-09-08 VITALS — BP 110/70 | HR 62 | Ht 65.5 in | Wt 158.2 lb

## 2019-09-08 DIAGNOSIS — R072 Precordial pain: Secondary | ICD-10-CM

## 2019-09-08 DIAGNOSIS — I471 Supraventricular tachycardia: Secondary | ICD-10-CM | POA: Diagnosis not present

## 2019-09-08 MED ORDER — METOPROLOL SUCCINATE ER 25 MG PO TB24: 25.0000 mg | ORAL_TABLET | Freq: Every day | ORAL | 0 refills | Status: DC

## 2019-09-08 MED ORDER — ATENOLOL 25 MG PO TABS: 25.0000 mg | ORAL_TABLET | Freq: Two times a day (BID) | ORAL | 0 refills | Status: DC

## 2019-09-08 MED ORDER — BISOPROLOL FUMARATE 5 MG PO TABS: 2.5000 mg | ORAL_TABLET | Freq: Every day | ORAL | 0 refills | Status: DC

## 2019-09-08 NOTE — Patient Instructions (Addendum)
Medication Instructions:  - Your physician has recommended you make the following change in your medication:   - Stop metoprolol tartrate  - You have been given 3 different beta blockers to try. You may take them in any order, but please DO NOT take more than 1 at a time. Try to give at lease 2 weeks on each medication to see how you tolerate them.  1) Metoprolol succinate 25 mg- take 1 tablet by mouth once daily 2) Atenolol 25 mg- take 1 tablet by mouth once daily 3) Bisoprolol 5 mg- take 0.5 tablet (2.5 mg) by mouth once daily   *If you need a refill on your cardiac medications before your next appointment, please call your pharmacy*  Lab Work: - Pending the date of your Cardiac CTA- BMP  If you have labs (blood work) drawn today and your tests are completely normal, you will receive your results only by: Marland Kitchen MyChart Message (if you have MyChart) OR . A paper copy in the mail If you have any lab test that is abnormal or we need to change your treatment, we will call you to review the results.  Testing/Procedures: - Your physician has requested that you have cardiac CT. Cardiac computed tomography (CT) is a painless test that uses an x-ray machine to take clear, detailed pictures of your heart. For further information please visit [email or web address] Please follow instructions below prior to your scan   - Your cardiac CT will be scheduled at one of the below locations:   [facility] [address] [place], [place] [address] [phone]  OR  [facility] [address] [place], [place] [address] [phone]  If scheduled at [facility], please arrive at the [facility] main entrance of [facility] 30-45 minutes prior to test start time. Proceed to the [facility] Radiology Department (first floor) to check-in and test prep.  If scheduled at [facility], please arrive 15 mins  early for check-in and test prep.  Please follow these instructions carefully (unless otherwise directed):  Hold all erectile dysfunction medications at least 3 days (72 hrs) prior to test.  On the Night Before the Test: . Be sure to Drink plenty of water. . Do not consume any caffeinated/decaffeinated beverages or chocolate 12 hours prior to your test. . Do not take any antihistamines 12 hours prior to your test.   On the Day of the Test: . Drink plenty of water. Do not drink any water within one hour of the test. . Do not eat any food 4 hours prior to the test. . You may take your regular medications prior to the test.  . Take metoprolol (Lopressor) two hours prior to test/ your current beta blocker 2 hours prior to your procedure . HOLD Furosemide/Hydrochlorothiazide morning of the test. . FEMALES- please wear underwire-free bra if available       After the Test: . Drink plenty of water. . After receiving IV contrast, you may experience a mild flushed feeling. This is normal. . On occasion, you may experience a mild rash up to 24 hours after the test. This is not dangerous. If this occurs, you can take Benadryl 25 mg and increase your fluid intake. . If you experience trouble breathing, this can be serious. If it is severe call 911 IMMEDIATELY. If it is mild, please call our office. . If you take any of these medications: Glipizide/Metformin, Avandament, Glucavance, please do not  take 48 hours after completing test unless otherwise instructed.   Once we have confirmed authorization from your insurance company, we will call you to set up a date and time for your test.   For non-scheduling related questions, please contact the cardiac imaging nurse navigator should you have any questions/concerns: Marchia Bond, RN Navigator Cardiac Imaging [facility] [phone] Office     Follow-Up: At [facility], you and your health needs are our priority.   As part of our continuing mission to provide you with exceptional heart care, we have created designated Provider Care Teams.  These Care Teams include your primary Cardiologist (physician) and Advanced Practice Providers (APPs -  Physician Assistants and Nurse Practitioners) who all work together to provide you with the care you need, when you need it.  Your next appointment:   12 week(s)  The format for your next appointment:   Virtual Visit   Provider:   Virl Axe, MD  Other Instructions n/a

## 2019-09-15 ENCOUNTER — Other Ambulatory Visit: Payer: Self-pay

## 2019-09-15 MED ORDER — METOPROLOL SUCCINATE ER 25 MG PO TB24: 25.0000 mg | ORAL_TABLET | Freq: Every day | ORAL | 0 refills | Status: DC

## 2019-09-21 ENCOUNTER — Telehealth: Payer: Self-pay | Admitting: Internal Medicine

## 2019-09-21 NOTE — Telephone Encounter (Signed)
Please call to update status of scheduling of CTscan

## 2019-09-22 NOTE — Telephone Encounter (Signed)
Per Mack Guise, the patient is scheduled for her Cardiac CT on 10/01/19 in [place]. The patient is aware.

## 2019-09-22 NOTE — Telephone Encounter (Signed)
Staff message sent to [employer] & Jannet Askew last week when the scan was ordered just to make them aware.  I have staff messaged Romie Minus again today to see if she knows if this is still being precerted.   Waiting on response.

## 2019-09-25 ENCOUNTER — Ambulatory Visit: Payer: BC Managed Care – PPO | Admitting: Internal Medicine

## 2019-09-26 ENCOUNTER — Other Ambulatory Visit: Payer: Self-pay | Admitting: Internal Medicine

## 2019-09-30 ENCOUNTER — Encounter (HOSPITAL_COMMUNITY): Payer: Self-pay

## 2019-09-30 ENCOUNTER — Ambulatory Visit: Admit: 2019-09-30 | Payer: BC Managed Care – PPO | Admitting: Gastroenterology

## 2019-09-30 ENCOUNTER — Telehealth (HOSPITAL_COMMUNITY): Payer: Self-pay | Admitting: Emergency Medicine

## 2019-09-30 SURGERY — COLONOSCOPY WITH PROPOFOL
Anesthesia: General

## 2019-09-30 NOTE — Telephone Encounter (Signed)
Reaching out to patient to offer assistance regarding upcoming cardiac imaging study; pt verbalizes understanding of appt date/time, parking situation and where to check in, pre-test NPO status and medications ordered, and verified current allergies; name and call back number provided for further questions should they arise Kemesha Mosey RN Navigator Cardiac Imaging [facility] [phone] office [phone] cell 

## 2019-10-01 ENCOUNTER — Telehealth (HOSPITAL_COMMUNITY): Payer: Self-pay | Admitting: Emergency Medicine

## 2019-10-01 ENCOUNTER — Ambulatory Visit
Admission: RE | Admit: 2019-10-01 | Discharge: 2019-10-01 | Disposition: A | Discharge status: Home / Self Care | Payer: BC Managed Care – PPO | Source: Ambulatory Visit | Attending: Internal Medicine | Admitting: Internal Medicine

## 2019-10-01 ENCOUNTER — Other Ambulatory Visit: Payer: Self-pay

## 2019-10-01 ENCOUNTER — Ambulatory Visit: Payer: BC Managed Care – PPO | Admitting: Cardiology

## 2019-10-01 DIAGNOSIS — R072 Precordial pain: Secondary | ICD-10-CM

## 2019-10-01 LAB — POCT I-STAT CREATININE: Creatinine, Ser: 0.8 mg/dL (ref 0.44–1.00)

## 2019-10-01 MED ORDER — METOPROLOL TARTRATE 5 MG/5ML IV SOLN
5.0000 mg | INTRAVENOUS | Status: DC | PRN
  Administered 2019-10-01: 5 mg via INTRAVENOUS

## 2019-10-01 MED ORDER — NITROGLYCERIN 0.4 MG SL SUBL
0.8000 mg | SUBLINGUAL_TABLET | Freq: Once | SUBLINGUAL | Status: AC
  Administered 2019-10-01: 0.8 mg via SUBLINGUAL

## 2019-10-01 MED ORDER — IOHEXOL 350 MG/ML SOLN
75.0000 mL | Freq: Once | INTRAVENOUS | Status: AC | PRN
  Administered 2019-10-01: 75 mL via INTRAVENOUS

## 2019-10-01 NOTE — Progress Notes (Signed)
Patient tolerated CT without incident. Drank soda after. Ambulated steady gait to exit.

## 2019-10-01 NOTE — Telephone Encounter (Signed)
Pt calling to clarify medication questions: instructed patient to take PO metoprolol 2 hr prior to appt which would be 10:30a) pt verbalized understanding. No further questions  Marchia Bond RN Navigator Cardiac Imaging [facility] [phone] Office  [phone] Cell

## 2019-10-02 ENCOUNTER — Ambulatory Visit: Payer: BC Managed Care – PPO | Admitting: Cardiology

## 2019-10-12 ENCOUNTER — Encounter: Payer: Self-pay | Admitting: Cardiology

## 2019-10-12 ENCOUNTER — Other Ambulatory Visit: Payer: BC Managed Care – PPO

## 2019-10-12 ENCOUNTER — Other Ambulatory Visit: Payer: Self-pay

## 2019-10-12 ENCOUNTER — Ambulatory Visit (INDEPENDENT_AMBULATORY_CARE_PROVIDER_SITE_OTHER): Payer: BC Managed Care – PPO | Admitting: Cardiology

## 2019-10-12 VITALS — BP 110/78 | HR 54 | Ht 65.0 in | Wt 156.2 lb

## 2019-10-12 DIAGNOSIS — I471 Supraventricular tachycardia: Secondary | ICD-10-CM

## 2019-10-12 NOTE — Progress Notes (Signed)
Cardiology Office Note:    Date:  10/12/2019   ID:  Tanya Hill, DOB 1965/11/02, MRN [id number]  PCP:  Galen ManilaKennedy, Lauren Renee, NP (Inactive)  Cardiologist:  Debbe OdeaBrian Agbor-Etang, MD  Electrophysiologist:  None   Referring MD: No ref. provider found   Chief Complaint  Patient presents with  . other    Pt f/u CT no complaints today. Meds verbally reviewed w/ Pt.     History of Present Illness:    Tanya Hill is a 53 y.o. female with a hx of hyperlipidemia, hypertension, who presents for a follow-up.  Patient was originally seen due to palpitations.    patient states having symptoms of palpitations about 3 years ago.  At the time she was going through a stressful divorce, and also in nursing school.  She states symptoms lasted couple of minutes and then stopped.  Her blood pressure at the time was slightly elevated in the 130s. She noticed some chest discomfort and shortness of breath 3 days later while moving some chairs at home.  She was evaluated in the ED where work-up via pharmacologic stress myocardial perfusion imaging was normal.  Chest CTA to evaluate for PE was also normal.  Echocardiogram showed normal ejection fraction, cardiac monitor did reveal episodes of SVTs.  She was started on beta-blocker and referred to EP, recommended continuation of beta-blocker..  Also had coronary CT angiogram with no evidence for coronary artery disease.  She states feeling great, has no symptoms.   Past Medical History:  Diagnosis Date  . Endometriosis   . 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 Hyperlipidemia   . Hypertension 2017    Past Surgical History:  Procedure Laterality Date  . ABDOMINAL HYSTERECTOMY  2008  . CESAREAN SECTION  1990  . LAPAROSCOPIC SALPINGO OOPHERECTOMY Right 02/25/2017   Procedure: LAPAROSCOPIC RIGHT SALPINGO OOPHORECTOMY;  Surgeon: Linzie CollinEvans, David James, MD;  Location: [facility] ORS;  Service: Gynecology;  Laterality: Right;  . LAPAROTOMY N/A 02/25/2017    Procedure: LAPAROTOMY;  Surgeon: Linzie CollinEvans, David James, MD;  Location: [facility] ORS;  Service: Gynecology;  Laterality: N/A;  . OOPHORECTOMY Left     Current Medications: Current Meds  Medication Sig  . Cholecalciferol (VITAMIN D) 2000 units tablet Take 2,000 Units by mouth daily.  . fexofenadine (ALLEGRA) 180 MG tablet Take 180 mg by mouth daily.  . metoprolol succinate (TOPROL-XL) 25 MG 24 hr tablet Take 1 tablet by mouth once daily  . Multiple Vitamins-Minerals (MULTIVITAMIN WITH MINERALS) tablet Take 1 tablet by mouth daily.  . [DISCONTINUED] atorvastatin (LIPITOR) 20 MG tablet Take 1 tablet (20 mg total) by mouth at bedtime.     Allergies:   Latex and Penicillins   Social History   Socioeconomic History  . Marital status: Divorced    Spouse name: Not on file  . Number of children: Not on file  . Years of education: Not on file  . Highest education level: Not on file  Occupational History  . Not on file  Tobacco Use  . Smoking status: Never Smoker  . Smokeless tobacco: Never Used  Substance and Sexual Activity  . Alcohol use: No  . Drug use: No  . Sexual activity: Not Currently    Birth control/protection: None  Other Topics Concern  . Not on file  Social History Narrative  . Not on file   Social Determinants of Health   Financial Resource Strain:   . Difficulty of Paying Living Expenses: Not on file  Food  Insecurity:   . Worried About Programme researcher, broadcasting/film/video in the Last Year: Not on file  . Ran Out of Food in the Last Year: Not on file  Transportation Needs:   . Lack of Transportation (Medical): Not on file  . Lack of Transportation (Non-Medical): Not on file  Physical Activity:   . Days of Exercise per Week: Not on file  . Minutes of Exercise per Session: Not on file  Stress:   . Feeling of Stress : Not on file  Social Connections:   . Frequency of Communication with Friends and Family: Not on file  . Frequency of Social Gatherings with Friends and Family: Not on  file  . Attends Religious Services: Not on file  . Active Member of Clubs or Organizations: Not on file  . Attends Banker Meetings: Not on file  . Marital Status: Not on file     Family History: The patient's family history includes Breast cancer in her cousin, paternal aunt, and another family member; Cancer in her father; Healthy in her brother; Hyperlipidemia in her father; Hypertension in her mother; Stroke in her mother.  ROS:   Please see the history of present illness.     All other systems reviewed and are negative.  EKGs/Labs/Other Studies Reviewed:    The following studies were reviewed today:  Coronary CTA, 10/01/2019 IMPRESSION: 1. Coronary calcium score of 0. This was 0 percentile for age and sex matched control.  2. Normal coronary origin with right dominance.  3. No evidence of CAD.  4. CAD-RADS 0. No evidence of CAD (0%). Consider non-atherosclerotic causes of chest pain.  TTE 08/27/2019 1. Left ventricular ejection fraction, by visual estimation, is 60 to 65%. The left ventricle has normal function. There is no left ventricular hypertrophy.  2. Global right ventricle has normal systolic function.The right ventricular size is normal. No increase in right ventricular wall thickness.  3. Left atrial size was normal.  4. There is mild dilatation of the aortic root measuring 37 mm.  5. Normal pulmonary artery systolic pressure.  2-week cardiac monitor date 07/23/2019 Patient had a min HR of 47 bpm, max HR of 197 bpm, and avg HR of 77 bpm. Predominant underlying rhythm was Sinus Rhythm. Slight P wave morphology changes were noted. 4 Supraventricular Tachycardia runs occurred, the run with the fastest interval lasting 8.5 secs with a max rate of 197 bpm, the longest lasting 18 beats with an avg rate of 112 bpm. Supraventricular Tachycardia was detected within +/- 45 seconds of symptomatic patient event(s). Isolated SVEs were rare (<1.0%),  SVE Couplets were rare (<1.0%), and SVE Triplets were rare (<1.0%). Isolated VEs were rare (<1.0%), VE Couplets were rare (<1.0%)  EKG:  EKG is  ordered today.  The ekg ordered today demonstrates normal sinus tachycardia, heart rate 54, otherwise normal ECG.  Recent Labs: 05/26/2019: ALT 18; BUN 16; Hemoglobin 13.5; Platelets 262; Potassium 4.2; Sodium 142 10/01/2019: Creatinine, Ser 0.80  Recent Lipid Panel    Component Value Date/Time   CHOL 215 (H) 05/26/2019 1003   TRIG 86 05/26/2019 1003   HDL 47 (L) 05/26/2019 1003   CHOLHDL 4.6 05/26/2019 1003   LDLCALC 149 (H) 05/26/2019 1003    Physical Exam:    VS:  BP 110/78 (BP Location: Left Arm, Patient Position: Sitting, Cuff Size: Normal)   Pulse (!) 54   Ht 5\' 5"  (1.651 m)   Wt 156 lb 3.2 oz (70.9 kg)   SpO2 98%  BMI 25.99 kg/m     Wt Readings from Last 3 Encounters:  10/12/19 156 lb 3.2 oz (70.9 kg)  09/08/19 158 lb 4 oz (71.8 kg)  09/03/19 158 lb 12 oz (72 kg)     GEN:  Well nourished, well developed in no acute distress HEENT: Normal NECK: No JVD; No carotid bruits LYMPHATICS: No lymphadenopathy CARDIAC: RRR, no murmurs, rubs, gallops RESPIRATORY:  Clear to auscultation without rales, wheezing or rhonchi  ABDOMEN: Soft, non-tender, non-distended MUSCULOSKELETAL:  No edema; No deformity  SKIN: Warm and dry NEUROLOGIC:  Alert and oriented x 3 PSYCHIATRIC:  Normal affect   ASSESSMENT:   History of SVTs 2-week cardiac monitor showed 4 episodes of SVTs, fastest interval lasting 8.5 seconds with a maximum rate of 197 bpm.  The longest lasting SVT was 18 beats.  Echocardiogram was otherwise normal with no structural abnormalities.  Coronary CTA with no evidence of coronary artery disease.  Her 10-year ASCVD risk is 1.3.  Statin is not indicated at this point. 1. SVT (supraventricular tachycardia) (HCC)    PLAN:    In order of problems listed above:  Continue Toprol-XL 25 mg daily.  Okay to stop statin.  Follow-up  in 1 year  This note was generated in part or whole with voice recognition software. Voice recognition is usually quite accurate but there are transcription errors that can and very often do occur. I apologize for any typographical errors that were not detected and corrected.  Medication Adjustments/Labs and Tests Ordered: Current medicines are reviewed at length with the patient today.  Concerns regarding medicines are outlined above.  Orders Placed This Encounter  Procedures  . EKG 12-Lead   No orders of the defined types were placed in this encounter.   Patient Instructions  Medication Instructions:  Your physician has recommended you make the following change in your medication:   STOP Atorvastatin  *If you need a refill on your cardiac medications before your next appointment, please call your pharmacy*  Lab Work: None ordered If you have labs (blood work) drawn today and your tests are completely normal, you will receive your results only by: Marland Kitchen MyChart Message (if you have MyChart) OR . A paper copy in the mail If you have any lab test that is abnormal or we need to change your treatment, we will call you to review the results.  Testing/Procedures: None ordered  Follow-Up: At [facility], you and your health needs are our priority.  As part of our continuing mission to provide you with exceptional heart care, we have created designated Provider Care Teams.  These Care Teams include your primary Cardiologist (physician) and Advanced Practice Providers (APPs -  Physician Assistants and Nurse Practitioners) who all work together to provide you with the care you need, when you need it.  Your next appointment:   12 month(s)  The format for your next appointment:   In Person  Provider:    You may see Kate Sable, MD or one of the following Advanced Practice Providers on your designated Care Team:    Murray Hodgkins, NP  Christell Faith, PA-C  Marrianne Mood,  PA-C   Other Instructions N/A     Signed, Kate Sable, MD  10/12/2019 12:39 PM    [place]

## 2019-10-12 NOTE — Patient Instructions (Addendum)
Medication Instructions:  Your physician has recommended you make the following change in your medication:   STOP Atorvastatin  *If you need a refill on your cardiac medications before your next appointment, please call your pharmacy*  Lab Work: None ordered If you have labs (blood work) drawn today and your tests are completely normal, you will receive your results only by: Marland Kitchen MyChart Message (if you have MyChart) OR . A paper copy in the mail If you have any lab test that is abnormal or we need to change your treatment, we will call you to review the results.  Testing/Procedures: None ordered  Follow-Up: At [facility], you and your health needs are our priority.  As part of our continuing mission to provide you with exceptional heart care, we have created designated Provider Care Teams.  These Care Teams include your primary Cardiologist (physician) and Advanced Practice Providers (APPs -  Physician Assistants and Nurse Practitioners) who all work together to provide you with the care you need, when you need it.  Your next appointment:   12 month(s)  The format for your next appointment:   In Person  Provider:    You may see Kate Sable, MD or one of the following Advanced Practice Providers on your designated Care Team:    Murray Hodgkins, NP  Christell Faith, PA-C  Marrianne Mood, PA-C   Other Instructions N/A

## 2019-10-27 ENCOUNTER — Other Ambulatory Visit: Payer: Self-pay

## 2019-10-27 MED ORDER — METOPROLOL SUCCINATE ER 25 MG PO TB24: 25.0000 mg | ORAL_TABLET | Freq: Every day | ORAL | 3 refills | Status: DC

## 2019-11-19 ENCOUNTER — Telehealth: Payer: BC Managed Care – PPO | Admitting: Internal Medicine

## 2019-12-05 IMAGING — MG DIGITAL SCREENING BILAT W/ TOMO W/ CAD
8 series · 8 of 24 positions shown · non-contrast
Comparison: Previous exam(s).

CLINICAL DATA: Screening.

EXAM:
DIGITAL SCREENING BILATERAL MAMMOGRAM WITH TOMO AND CAD

[L MLO synth-2D]
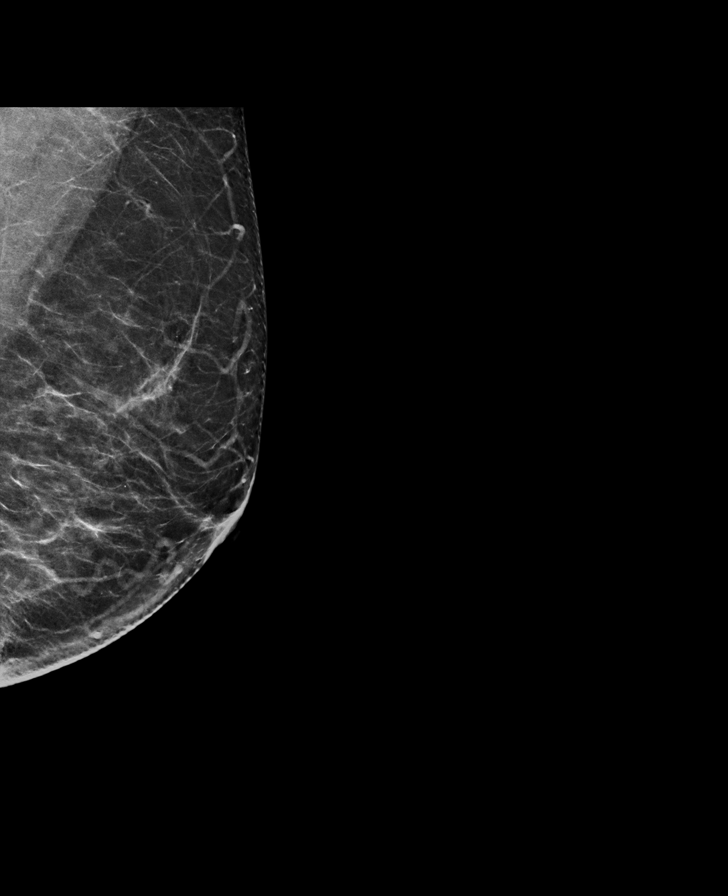

[R MLO synth-2D]
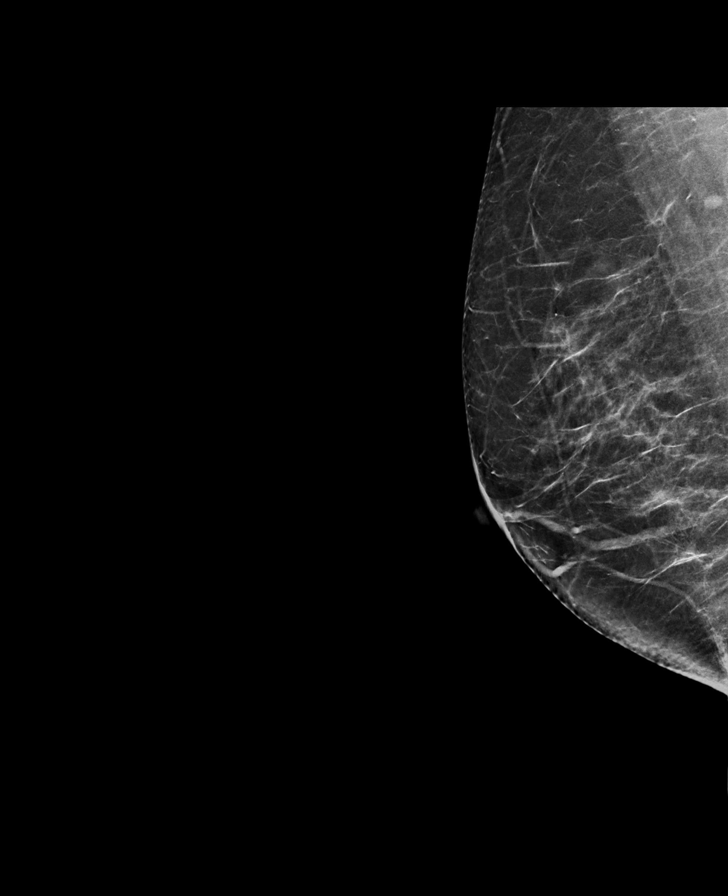

[R CC synth-2D]
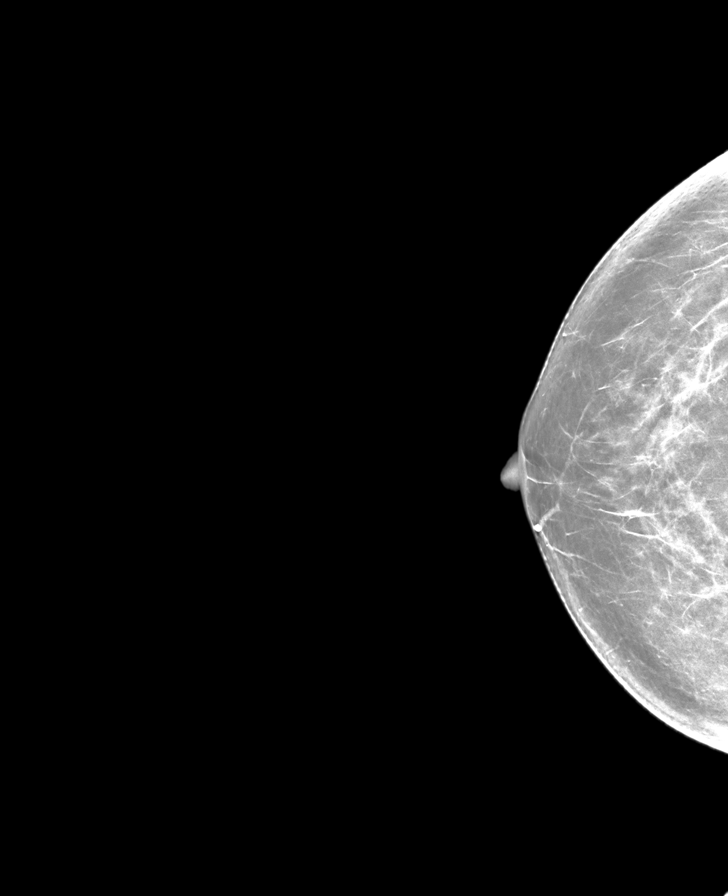

[L CC synth-2D]
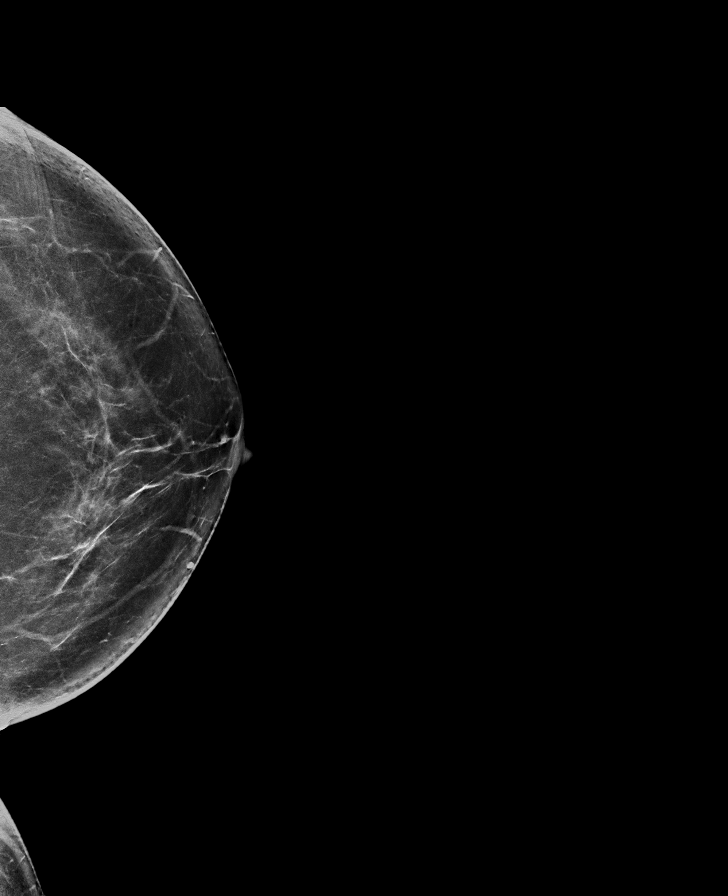

[R CC tomo · tomo slice 35/69.0]
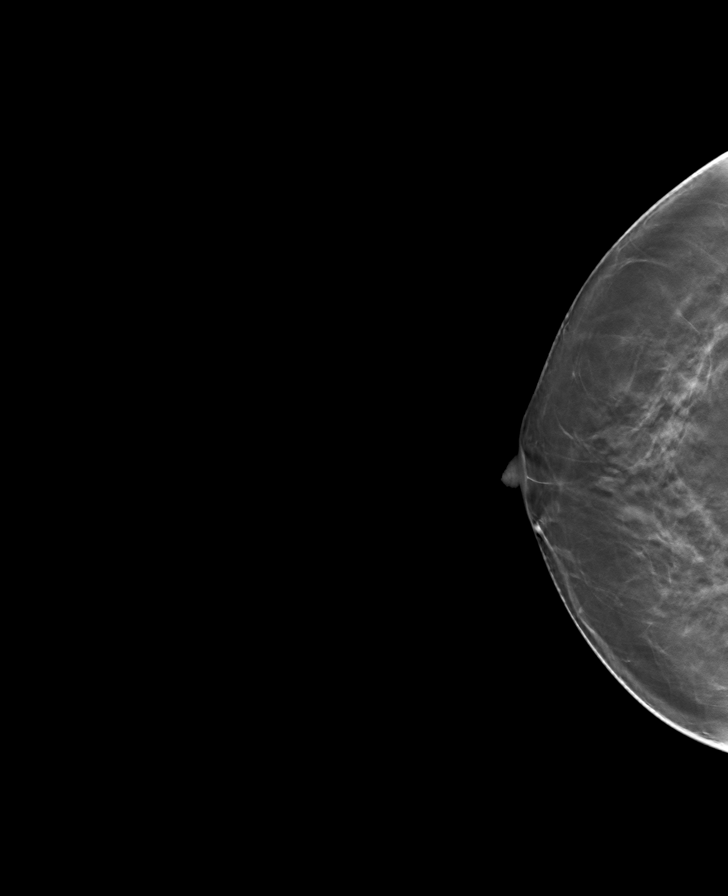

[L MLO tomo · tomo slice 35/68.0]
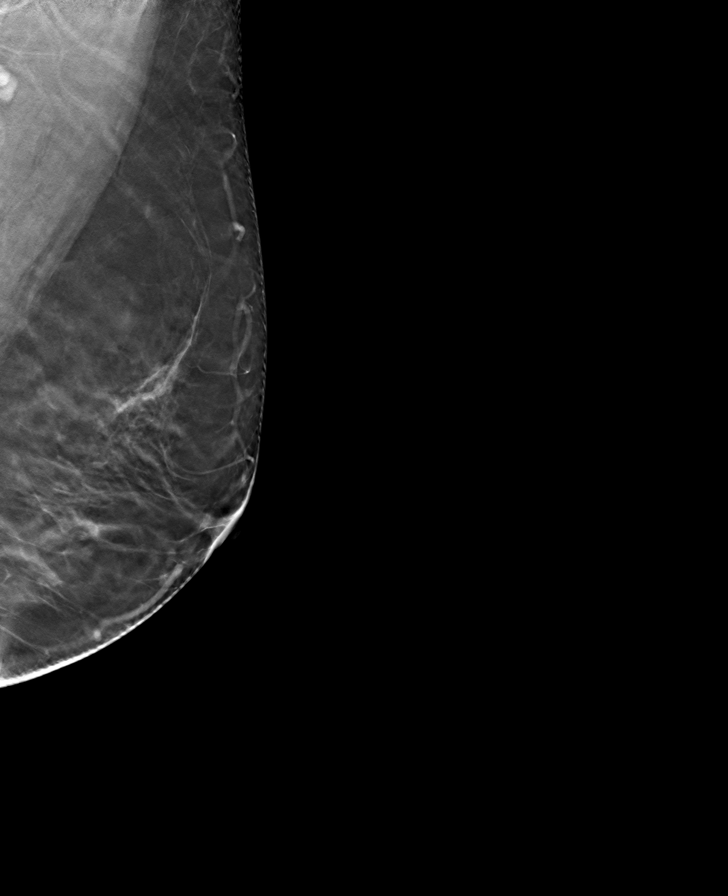

[L CC tomo · tomo slice 39/76.0]
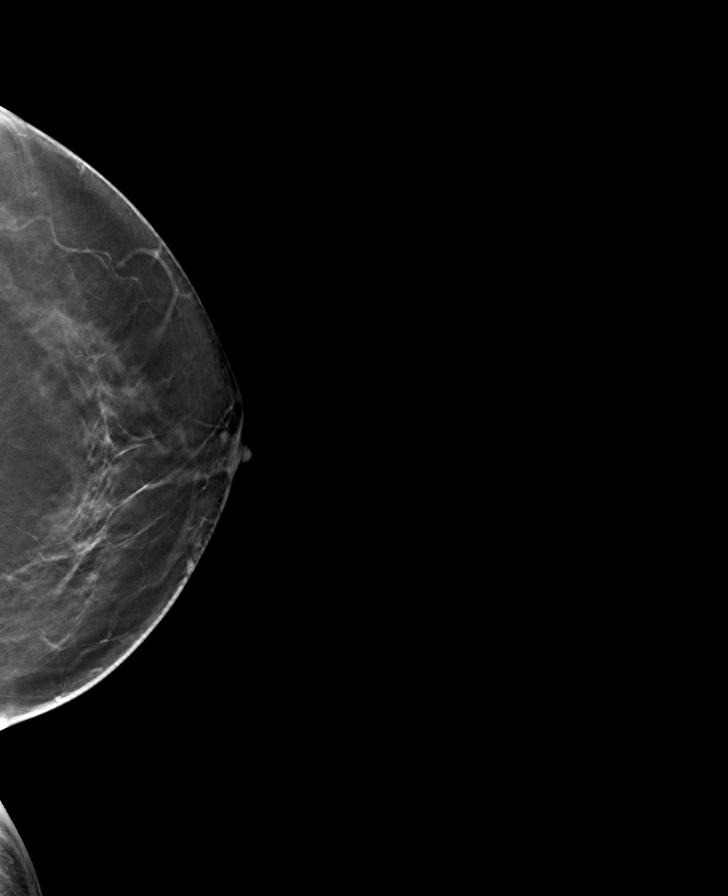

[R MLO tomo · tomo slice 35/69.0]
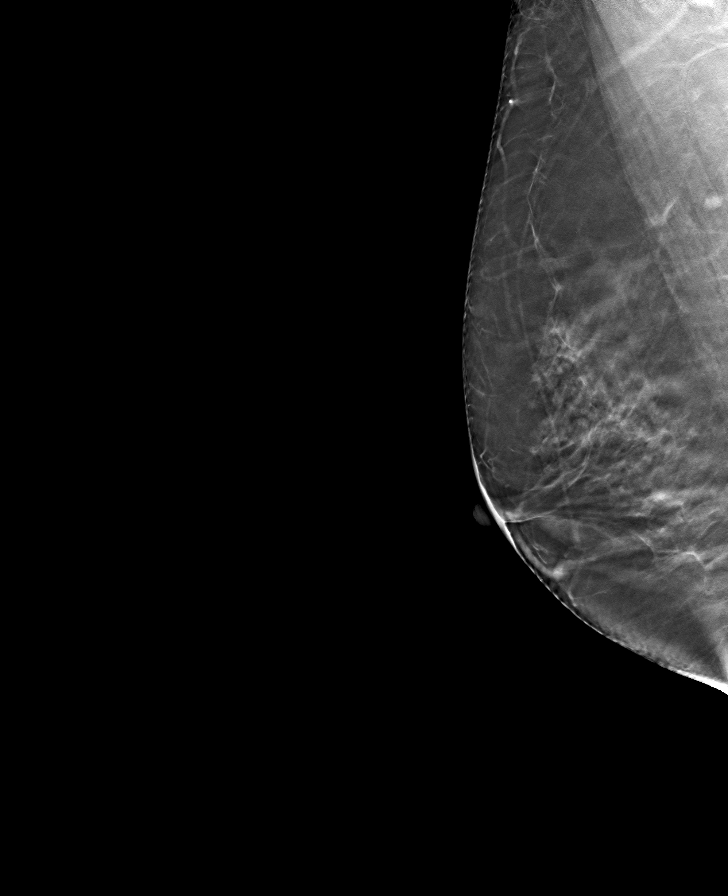

[8 of 24 positions shown; findings below may reference images not displayed]

ACR Breast Density Category b: There are scattered areas of
fibroglandular density.
FINDINGS: There are no findings suspicious for malignancy. Images were
processed with CAD.
IMPRESSION: No mammographic evidence of malignancy. A result letter of this
screening mammogram will be mailed directly to the patient.

RECOMMENDATION:
Screening mammogram in one year. (Code:[id number])

BI-RADS CATEGORY  1: Negative.

## 2020-01-03 ENCOUNTER — Other Ambulatory Visit: Payer: Self-pay

## 2020-01-03 ENCOUNTER — Emergency Department
Admission: EM | Admit: 2020-01-03 | Discharge: 2020-01-04 | Disposition: A | Discharge status: Home / Self Care | Payer: BC Managed Care – PPO | Attending: Emergency Medicine | Admitting: Emergency Medicine

## 2020-01-03 DIAGNOSIS — L237 Allergic contact dermatitis due to plants, except food: Secondary | ICD-10-CM | POA: Diagnosis not present

## 2020-01-03 DIAGNOSIS — I10 Essential (primary) hypertension: Secondary | ICD-10-CM | POA: Diagnosis not present

## 2020-01-03 DIAGNOSIS — R21 Rash and other nonspecific skin eruption: Secondary | ICD-10-CM | POA: Diagnosis present

## 2020-01-03 NOTE — ED Triage Notes (Signed)
Pt with rash noted to legs, arms abd for 3 days. Pt states took prednisone at 0630 and benadryl every 4 hours for rash. Pt states is itching. No angioedema noted. Pt denies shob, diarrhea,vomiting.

## 2020-01-04 ENCOUNTER — Telehealth: Payer: Self-pay

## 2020-01-04 ENCOUNTER — Ambulatory Visit: Payer: Self-pay | Admitting: *Deleted

## 2020-01-04 MED ORDER — DIPHENHYDRAMINE HCL 50 MG/ML IJ SOLN: 50.0000 mg | Freq: Once | INTRAMUSCULAR | Status: AC

## 2020-01-04 MED ORDER — METHYLPREDNISOLONE SODIUM SUCC 125 MG IJ SOLR
INTRAMUSCULAR | Status: AC
  Administered 2020-01-04: 125 mg via INTRAVENOUS
  Filled 2020-01-04: qty 2

## 2020-01-04 MED ORDER — PREDNISONE 20 MG PO TABS: 40.0000 mg | ORAL_TABLET | Freq: Every day | ORAL | 0 refills | Status: AC

## 2020-01-04 MED ORDER — DIPHENHYDRAMINE HCL 50 MG/ML IJ SOLN
INTRAMUSCULAR | Status: AC
  Administered 2020-01-04: 50 mg via INTRAVENOUS
  Filled 2020-01-04: qty 1

## 2020-01-04 MED ORDER — METHYLPREDNISOLONE SODIUM SUCC 125 MG IJ SOLR: 125.0000 mg | Freq: Once | INTRAMUSCULAR | Status: AC

## 2020-01-04 MED ORDER — FAMOTIDINE IN NACL 20-0.9 MG/50ML-% IV SOLN
20.0000 mg | Freq: Once | INTRAVENOUS | Status: AC
  Administered 2020-01-04: 20 mg via INTRAVENOUS

## 2020-01-04 NOTE — ED Notes (Signed)
Patient with complaints of difficulty breathing, dry mouth and blurry eyes after receiving Solu-medrol and benadryl. Dr. Manson Passey to bedside to see patinet.

## 2020-01-04 NOTE — ED Notes (Signed)
Patient given water at this time. Patient states that she is feeling better at this time.

## 2020-01-04 NOTE — Telephone Encounter (Signed)
Per initial encounter, "Pt called and stated that she was in the ER this morning and would like to know when she could take more Benadryl."; contacted pt and she states that she was given Benadryl IV at 0014 01/04/20 in the ED; reviewed discharge summary with pt; also advised pt to contact her PCP regarding taking PO Benadryl after having IV dose of medication; she verbalized understanding.  Reason for Disposition . [1] Caller requesting a NON-URGENT new prescription or refill AND [2] triager unable to refill per unit policy  Answer Assessment - Initial Assessment Questions 1.   NAME of MEDICATION: "What medicine are you calling about?"     Benadryl 2.   QUESTION: "What is your question?"     When can I take benadryl after having IV dose 3.   PRESCRIBING HCP: "Who prescribed it?" Reason: if prescribed by specialist, call should be referred to that group.    Given in the ED for poison oak 01/04/20 4. SYMPTOMS: "Do you have any symptoms?"      5. SEVERITY: If symptoms are present, ask "Are they mild, moderate or severe?"     6.  PREGNANCY:  "Is there any chance that you are pregnant?" "When was your last menstrual period?"  Protocols used: MEDICATION QUESTION CALL-A-AH

## 2020-01-04 NOTE — Telephone Encounter (Signed)
Patient called and reported that she was in ER last night/ this morning for poison ivy outbreak. They gave her IV benadryl and solumedrol at ER.  She has a rx for prednisone to pick up today. Her question is when can she take benadryl again since she got it at 12:14 am.

## 2020-01-04 NOTE — ED Provider Notes (Signed)
[facility] Emergency Department Provider Note  ____________________________________________   First MD Initiated Contact with Patient 01/04/20 0010     (approximate)  I have reviewed the triage vital signs and the nursing notes.   HISTORY  Chief Complaint Rash and Allergic Reaction    HPI Tanya Hill is a 54 y.o. female with below list of previous medical conditions presents to the emergency department secondary to concern for poison oak with associated pruritic rash x3 days.  Patient states that after being outside she noticed a rash 3 days ago.  Patient states that she had prednisone 20 mg tablets prescribed for sinus infection which she had at home and as such she began taking 1 a day and has done so for the past 3 days however rash has persisted despite taking prednisone and 25 mg of Benadryl every 6 hours as needed.  Patient denies any difficulty breathing no difficulty swallowing.      Past Medical History:  Diagnosis Date  . Endometriosis   . 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 Hyperlipidemia   . Hypertension 2017    Patient Active Problem List   Diagnosis Date Noted  . Mixed hyperlipidemia 08/27/2019  . On postmenopausal hormone replacement therapy 03/14/2018  . Goiter, nodular 04/01/2017  . Anxiety 08/20/2016  . Palpitation 04/05/2016  . Hypertension 10/16/2015    Past Surgical History:  Procedure Laterality Date  . ABDOMINAL HYSTERECTOMY  2008  . CESAREAN SECTION  1990  . LAPAROSCOPIC SALPINGO OOPHERECTOMY Right 02/25/2017   Procedure: LAPAROSCOPIC RIGHT SALPINGO OOPHORECTOMY;  Surgeon: Harlin Heys, MD;  Location: [facility] ORS;  Service: Gynecology;  Laterality: Right;  . LAPAROTOMY N/A 02/25/2017   Procedure: LAPAROTOMY;  Surgeon: Harlin Heys, MD;  Location: [facility] ORS;  Service: Gynecology;  Laterality: N/A;  . OOPHORECTOMY Left     Prior to Admission medications   Medication Sig Start Date End Date Taking?  Authorizing Provider  Cholecalciferol (VITAMIN D) 2000 units tablet Take 2,000 Units by mouth daily.    [provider]  fexofenadine (ALLEGRA) 180 MG tablet Take 180 mg by mouth daily.    [provider]  metoprolol succinate (TOPROL-XL) 25 MG 24 hr tablet Take 1 tablet (25 mg total) by mouth daily. 10/27/19   Kate Sable, MD  Multiple Vitamins-Minerals (MULTIVITAMIN WITH MINERALS) tablet Take 1 tablet by mouth daily.    [provider]    Allergies Latex and Penicillins  Family History  Problem Relation Age of Onset  . Hypertension Mother   . Stroke Mother   . Cancer Father        Kidney, prostate  . Hyperlipidemia Father   . Healthy Brother   . Breast cancer Paternal Aunt   . Breast cancer Cousin   . Breast cancer Other     Social History Social History   Tobacco Use  . Smoking status: Never Smoker  . Smokeless tobacco: Never Used  Substance Use Topics  . Alcohol use: No  . Drug use: No    Review of Systems Constitutional: No fever/chills Eyes: No visual changes. ENT: No sore throat. Cardiovascular: Denies chest pain. Respiratory: Denies shortness of breath. Gastrointestinal: No abdominal pain.  No nausea, no vomiting.  No diarrhea.  No constipation. Genitourinary: Negative for dysuria. Musculoskeletal: Negative for neck pain.  Negative for back pain. Integumentary: Positive for rash. Neurological: Negative for headaches, focal weakness or numbness.   ____________________________________________   PHYSICAL EXAM:  VITAL SIGNS: ED Triage  Vitals  Enc Vitals Group     BP 01/03/20 2122 (!) 141/81     Pulse Rate 01/03/20 2122 78     Resp 01/03/20 2122 16     Temp 01/03/20 2122 98 F (36.7 C)     Temp Source 01/03/20 2122 Oral     SpO2 01/03/20 2125 100 %     Weight 01/03/20 2122 72.6 kg (160 lb)     Height 01/03/20 2122 1.676 m (5\' 6" )     Head Circumference --      Peak Flow --      Pain Score 01/03/20 2122 7     Pain  Loc --      Pain Edu? --      Excl. in GC? --     Constitutional: Alert and oriented.  Eyes: Conjunctivae are normal.  Mouth/Throat: Patient is wearing a mask. Neck: No stridor.  No meningeal signs.   Cardiovascular: Normal rate, regular rhythm. Good peripheral circulation. Grossly normal heart sounds. Respiratory: Normal respiratory effort.  No retractions. Gastrointestinal: Soft and nontender. No distention.  Musculoskeletal: No lower extremity tenderness nor edema. No gross deformities of extremities. Neurologic:  Normal speech and language. No gross focal neurologic deficits are appreciated.  Skin: Diffuse urticarial rash.  Right forearm rash consistent with poison oak erythematous maculopapular rash with linear distribution. Psychiatric: Mood and affect are normal. Speech and behavior are normal.   Procedures   ____________________________________________   INITIAL IMPRESSION / MDM / ASSESSMENT AND PLAN / ED COURSE  As part of my medical decision making, I reviewed the following data within the electronic MEDICAL RECORD NUMBER   54 year old female present with above-stated history and physical exam secondary to urticaria secondary to poison oak.  Patient given IV Solu-Medrol Benadryl and Pepcid in the emergency department with improvement of symptoms.  Patient be prescribed prednisone for home.  ____________________________________________  FINAL CLINICAL IMPRESSION(S) / ED DIAGNOSES  Final diagnoses:  Poison oak     MEDICATIONS GIVEN DURING THIS VISIT:  Medications  famotidine (PEPCID) IVPB 20 mg premix (20 mg Intravenous New Bag/Given 01/04/20 0015)  diphenhydrAMINE (BENADRYL) injection 50 mg (50 mg Intravenous Given 01/04/20 0014)  methylPREDNISolone sodium succinate (SOLU-MEDROL) 125 mg/2 mL injection 125 mg (125 mg Intravenous Given 01/04/20 0014)     ED Discharge Orders    None      *Please note:  [name] was evaluated in Emergency Department on  01/04/2020 for the symptoms described in the history of present illness. She was evaluated in the context of the global COVID-19 pandemic, which necessitated consideration that the patient might be at risk for infection with the SARS-CoV-2 virus that causes COVID-19. Institutional protocols and algorithms that pertain to the evaluation of patients at risk for COVID-19 are in a state of rapid change based on information released by regulatory bodies including the CDC and federal and state organizations. These policies and algorithms were followed during the patient's care in the ED.  Some ED evaluations and interventions may be delayed as a result of limited staffing during the pandemic.*  Note:  This document was prepared using Dragon voice recognition software and may include unintentional dictation errors.   01/06/2020, MD 01/04/20 [phone]

## 2020-01-04 NOTE — ED Notes (Signed)
ED Provider at bedside. 

## 2020-01-05 NOTE — Telephone Encounter (Signed)
Can take Benadryl 25-50mg  (1-2 tablets) every 4-6 hours as needed.

## 2020-01-05 NOTE — Telephone Encounter (Signed)
The pt was notified and she verbalize understanding.  

## 2020-01-07 ENCOUNTER — Other Ambulatory Visit: Payer: Self-pay

## 2020-01-07 ENCOUNTER — Telehealth: Payer: BC Managed Care – PPO | Admitting: Internal Medicine

## 2020-01-13 ENCOUNTER — Ambulatory Visit (INDEPENDENT_AMBULATORY_CARE_PROVIDER_SITE_OTHER): Payer: BC Managed Care – PPO | Admitting: Family Medicine

## 2020-01-13 ENCOUNTER — Other Ambulatory Visit: Payer: Self-pay

## 2020-01-13 ENCOUNTER — Encounter: Payer: Self-pay | Admitting: Family Medicine

## 2020-01-13 VITALS — BP 120/68 | HR 68 | Temp 97.1°F | Ht 66.0 in | Wt 153.4 lb

## 2020-01-13 DIAGNOSIS — R062 Wheezing: Secondary | ICD-10-CM | POA: Diagnosis not present

## 2020-01-13 DIAGNOSIS — L509 Urticaria, unspecified: Secondary | ICD-10-CM | POA: Diagnosis not present

## 2020-01-13 DIAGNOSIS — R21 Rash and other nonspecific skin eruption: Secondary | ICD-10-CM

## 2020-01-13 DIAGNOSIS — J302 Other seasonal allergic rhinitis: Secondary | ICD-10-CM | POA: Diagnosis not present

## 2020-01-13 DIAGNOSIS — T148XXA Other injury of unspecified body region, initial encounter: Secondary | ICD-10-CM

## 2020-01-13 DIAGNOSIS — F419 Anxiety disorder, unspecified: Secondary | ICD-10-CM

## 2020-01-13 MED ORDER — MONTELUKAST SODIUM 10 MG PO TABS: 10.0000 mg | ORAL_TABLET | Freq: Every day | ORAL | 3 refills | Status: DC

## 2020-01-13 MED ORDER — PREDNISONE 10 MG PO TABS: ORAL_TABLET | ORAL | 0 refills | Status: AC

## 2020-01-13 MED ORDER — ALBUTEROL SULFATE HFA 108 (90 BASE) MCG/ACT IN AERS: 2.0000 | INHALATION_SPRAY | Freq: Four times a day (QID) | RESPIRATORY_TRACT | 1 refills | Status: AC | PRN

## 2020-01-13 NOTE — Progress Notes (Signed)
Subjective:    Patient ID: Tanya Hill, female    DOB: [birth year], 54 y.o.   MRN: [id number]  Tanya Hill is a 54 y.o. female presenting on 01/13/2020 for Urticaria (pt recently diagnose with poison oak x 9 days ago. She had rash all over her body that since resolved, but now having hives popping up every 4 hrs after the antihistimine wear off), Anxiety (situational anxiety the patient recently lose her father over the past week and unsure if the hives are related .), and Toe Pain (blister on the right second toe )   HPI  [name] presents to clinic for concerns of continued rash with hives.  Reports had been exposed to poison oak approx 9 days ago, had treatment through the emergency department with a 5 day taper of prednisone, that had originally helped but then once the prednisone was completed she rebounded and has been taking benadryl every 4-6 hours for relief of her hives.    Has a history of skin elevation with pain when being exposed to cold water/water bottles on the skin.  Has photos to share in clinic of when she was on a trip out of the country a few years ago, but has continued with symptoms if her skin touches cold water.  Referral to dermatology requested.  Has had some toe pain on her right foot between 2nd and 3rd toes.  Believes may be a blister as she has been up on her feet more recently than she typically is.  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  Denies SI/HI.   Depression screen [facility] 2/9 07/21/2019 05/25/2019 04/01/2017  Decreased Interest 0 0 0  Down, Depressed, Hopeless 0 1 0  PHQ - 2 Score 0 1 0  Altered sleeping - - 1  Tired, decreased energy - - 1  Change in appetite - - 0  Feeling bad or failure about yourself  - - 0  Trouble concentrating - - 0  Moving slowly or fidgety/restless - - 0  Suicidal thoughts - - 0  PHQ-9 Score - - 2    Social History   Tobacco Use    . Smoking status: Never Smoker  . Smokeless tobacco: Never Used  Substance Use Topics  . Alcohol use: No  . Drug use: No    Review of Systems  Constitutional: Negative.   HENT: Negative.   Eyes: Negative.   Respiratory: Negative.   Cardiovascular: Negative.   Gastrointestinal: Negative.   Endocrine: Negative.   Genitourinary: Negative.   Musculoskeletal: Negative.   Skin: Positive for rash. Negative for color change, pallor and wound.  Allergic/Immunologic: Negative.   Neurological: Negative.   Hematological: Negative.   Psychiatric/Behavioral: Negative for agitation, behavioral problems, confusion, decreased concentration, dysphoric mood, hallucinations, self-injury, sleep disturbance and suicidal ideas. The patient is nervous/anxious. The patient is not hyperactive.    Per HPI unless specifically indicated above     Objective:    BP 120/68 (BP Location: Right Arm)   Pulse 68   Temp (!) 97.1 F (36.2 C) (Temporal)   Ht 5\' 6"  (1.676 m)   Wt 153 lb 6.4 oz (69.6 kg)   BMI 24.76 kg/m   Wt Readings from Last 3 Encounters:  01/13/20 153 lb 6.4 oz (69.6 kg)  01/03/20 160 lb (72.6 kg)  10/12/19 156 lb 3.2 oz (70.9 kg)    Physical Exam Vitals reviewed.  Constitutional:  General: She is not in acute distress.    Appearance: Normal appearance. She is well-groomed and normal weight. She is not ill-appearing or toxic-appearing.  HENT:     Head: Normocephalic.  Eyes:     General: Lids are normal. Vision grossly intact.        Right eye: No discharge.        Left eye: No discharge.     Extraocular Movements: Extraocular movements intact.     Conjunctiva/sclera: Conjunctivae normal.     Pupils: Pupils are equal, round, and reactive to light.  Cardiovascular:     Rate and Rhythm: Normal rate and regular rhythm.     Pulses: Normal pulses.          Dorsalis pedis pulses are 2+ on the right side and 2+ on the left side.       Posterior tibial pulses are 2+ on the right  side and 2+ on the left side.     Heart sounds: Normal heart sounds. No murmur. No friction rub. No gallop.   Pulmonary:     Effort: Pulmonary effort is normal. No respiratory distress.     Breath sounds: Normal breath sounds.  Abdominal:     General: Abdomen is flat. Bowel sounds are normal. There is no distension.     Palpations: Abdomen is soft.  Musculoskeletal:     Right lower leg: No edema.     Left lower leg: No edema.  Feet:     Right foot:     Skin integrity: Skin integrity normal.     Left foot:     Skin integrity: Skin integrity normal.  Skin:    General: Skin is warm and dry.     Capillary Refill: Capillary refill takes less than 2 seconds.     Findings: Rash present.     Comments: Rash on bilateral upper arms.  Blister between right foot 2nd + 3rd toes.  Neurological:     General: No focal deficit present.     Mental Status: She is alert and oriented to person, place, and time.     Cranial Nerves: No cranial nerve deficit.     Sensory: No sensory deficit.     Motor: No weakness.     Coordination: Coordination normal.     Gait: Gait normal.  Psychiatric:        Attention and Perception: Attention and perception normal.        Mood and Affect: Mood is anxious. Affect is tearful.        Speech: Speech normal.        Behavior: Behavior normal. Behavior is cooperative.        Thought Content: Thought content normal.        Cognition and Memory: Cognition and memory normal.        Judgment: Judgment normal.     Results for orders placed or performed in visit on 01/13/20  CBC with Differential  Result Value Ref Range   WBC 4.9 3.8 - 10.8 Thousand/uL   RBC 4.40 3.80 - 5.10 Million/uL   Hemoglobin 13.3 11.7 - 15.5 g/dL   HCT 84.6 96.2 - 95.2 %   MCV 91.4 80.0 - 100.0 fL   MCH 30.2 27.0 - 33.0 pg   MCHC 33.1 32.0 - 36.0 g/dL   RDW 84.1 32.4 - 40.1 %   Platelets 265 140 - 400 Thousand/uL   MPV 11.2 7.5 - 12.5 fL   Neutro Abs 3,146 1,500 - 7,800  cells/uL    Lymphs Abs 1,181 850 - 3,900 cells/uL   Absolute Monocytes 363 200 - 950 cells/uL   Eosinophils Absolute 181 15 - 500 cells/uL   Basophils Absolute 29 0 - 200 cells/uL   Neutrophils Relative % 64.2 %   Total Lymphocyte 24.1 %   Monocytes Relative 7.4 %   Eosinophils Relative 3.7 %   Basophils Relative 0.6 %  COMPLETE METABOLIC PANEL WITH GFR  Result Value Ref Range   Glucose, Bld 90 65 - 139 mg/dL   BUN 15 7 - 25 mg/dL   Creat 7.56 4.33 - 2.95 mg/dL   GFR, Est Non African American 79 > OR = 60 mL/min/1.20m2   GFR, Est African American 92 > OR = 60 mL/min/1.52m2   BUN/Creatinine Ratio NOT APPLICABLE 6 - 22 (calc)   Sodium 140 135 - 146 mmol/L   Potassium 4.5 3.5 - 5.3 mmol/L   Chloride 105 98 - 110 mmol/L   CO2 30 20 - 32 mmol/L   Calcium 9.7 8.6 - 10.4 mg/dL   Total Protein 6.4 6.1 - 8.1 g/dL   Albumin 4.3 3.6 - 5.1 g/dL   Globulin 2.1 1.9 - 3.7 g/dL (calc)   AG Ratio 2.0 1.0 - 2.5 (calc)   Total Bilirubin 0.4 0.2 - 1.2 mg/dL   Alkaline phosphatase (APISO) 81 37 - 153 U/L   AST 15 10 - 35 U/L   ALT 18 6 - 29 U/L      Assessment & Plan:   Problem List Items Addressed This Visit      Musculoskeletal and Integument   Rash    Likely rebound rash from the 5 day taper from Prednisone after exposure to poison oak.  Will treat with longer taper to break the histamine response cycle.  Will refer to dermatology for rash with elevation with exposure to cold water on body.    Plan: 1) Take prednisone taper as directed 2) Return to clinic if worsening of symptoms or if no relief with current treatment plan.        Other   Anxiety    Acute grief response with anxiety due to loss of father.  Discussed self care during this time and provided education on mood and sleep hygiene.  Denied any needs for daily SSRI for a short course with acute grief response.  Let patient know if anything changed to contact our office and can discuss via the phone.      Blister    Blister noted on  right foot between 2nd and 3rd toes.  Discussed has a callus over it now, can soak and lightly use a pumice stone to help with softening the skin and can coat with aquaphor and/or vaseline to help stay moisturized until resolves.       Other Visit Diagnoses    Hives    -  Primary   Relevant Medications   predniSONE (DELTASONE) 10 MG tablet   Other Relevant Orders   CBC with Differential (Completed)   COMPLETE METABOLIC PANEL WITH GFR (Completed)   Ambulatory referral to Dermatology   Ambulatory referral to Allergy   Seasonal allergies       Relevant Medications   montelukast (SINGULAIR) 10 MG tablet   Other Relevant Orders   Ambulatory referral to Allergy   Wheezing       Relevant Medications   albuterol (VENTOLIN HFA) 108 (90 Base) MCG/ACT inhaler      Meds ordered this encounter  Medications  .  predniSONE (DELTASONE) 10 MG tablet    Sig: Take 4 tablets (40 mg total) by mouth daily with breakfast for 3 days, THEN 3 tablets (30 mg total) daily with breakfast for 3 days, THEN 2 tablets (20 mg total) daily with breakfast for 3 days, THEN 1 tablet (10 mg total) daily with breakfast for 3 days.    Dispense:  30 tablet    Refill:  0  . albuterol (VENTOLIN HFA) 108 (90 Base) MCG/ACT inhaler    Sig: Inhale 2 puffs into the lungs every 6 (six) hours as needed for wheezing or shortness of breath.    Dispense:  6.7 g    Refill:  1  . montelukast (SINGULAIR) 10 MG tablet    Sig: Take 1 tablet (10 mg total) by mouth at bedtime.    Dispense:  30 tablet    Refill:  3      Follow up plan: Return if symptoms worsen or fail to improve.   Harlin Rain, [place] [facility] Nurse Practitioner [facility] 01/12/2020, 3:08 PM

## 2020-01-13 NOTE — Patient Instructions (Signed)
As we discussed, I have put in a referral to dermatology for your long term skin concerns for hives when cold water touches your skin.  The referral coordinator will be contacting you to schedule this appointment.  I have also put in a referral to allergist, since you have sporadic stridor, to help identify your allergy trigger.  They will contact you to schedule.  Have your labs completed and we will contact you with the results.  Please let me know, once you return to work, if you are finding difficulty with the grief process.  I am here as a partner in your healthcare and we can come up with a short term treatment plan to help with your anxiety, if you decide.  The following recommendations are helpful adjuncts for helping rebalance your mood.  Eat a nourishing diet. Ensure adequate intake of calories, protein, carbs, fat, vitamins, and minerals. Prioritize whole foods at each meal, including meats, vegetables, fruits, nuts and seeds, etc.   Avoid inflammatory and/or "junk" foods, such as sugar, omega-6 fats, refined grains, chemicals, and preservatives are common in packaged and prepared foods. Minimize or completely avoid these ingredients and stick to whole foods with little to no additives. Cook from scratch as much as possible for more control over what you eat  Get enough sleep. Poor sleep is significantly associated with depression and anxiety. Make 7-9 hours of sleep nightly a top priority  Exercise appropriately. Exercise is known to improve brain functioning and boost mood. Aim for 30 minutes of daily physical activity. Avoid "overtraining," which can cause mental disturbances  Assess your light exposure. Not enough natural light during the day and too much artificial light can have a major impact on your mood. Get outside as often as possible during daylight hours. Minimize light exposure after dark and avoid the use of electronics that give off blue light before bed  Manage your  stress.  Use daily stress management techniques such as meditation, yoga, or mindfulness to retrain your brain to respond differently to stress. Try deep breathing to deactivate your "fight or flight" response.  There are many of sources with apps like Headspace, Calm or a variety of YouTube videos (videos from Gwynne Edinger have guided meditation)  Prioritize your social life. Work on building social support with new friends or improve current relationships. Consider getting a pet that allows for companionship, social interaction, and physical touch. Try volunteering or joining a faith-based community to increase your sense of purpose  4-7-8 breathing technique at bedtime: breathe in to count of 4, hold breath for count of 7, exhale for count of 8; do 3-5 times for letting go of overactive thoughts  Take time to play Unstructured "play" time can help reduce anxiety and depression Options for play include music, games, sports, dance, art, etc.  Try to add daily omega 3 fatty acids, magnesium, B complex, and balanced amino acid supplements to help improve mood and anxiety.  Sleep hygiene is the single most effective treatment for sleep issues, but it is hard work.  Tips for a good night's sleep:  -Keep sleep environment comfortable and conducive to sleep -Keep regular sleep schedule 7 nights a week -Avoiding naps during the day -Avoiding going to bed until drowsy and ready to sleep, not trying to sleep, and not watching the clock -Get out of bed if not asleep within 15-20 minutes and returning only when drowsy -Avoiding caffeine, nicotine, alcohol, and other substances that interfere with sleep before bedtime -Take  an hour before your set bedtime and start to wind down: bath/shower, no more TV or phone (the blue light can interfere with sleeping), listen to soothing music, or meditation -No TV in your bedroom -Exercising regularly, at least 6 hours before sleep. Yoga and Tai Chi can improve  sleep quality  There are a lot of books and apps that may help guide you with any of the following:   -Progressive muscle relaxation (involves methodical tension and relaxation of different Muscle groups throughout body)  Guided imagery  -YouTube - Gwynne Edinger has free videos on YouTube that can help with meditation and some   Abdominal breathing   Over the counter sleep aid one hour before bed- and gradually wean your use over 2-4 weeks  Some examples are : *Melatonin 5-10 mg *Sleepology (Can find on [employer]) taken according to packaging directions  There are a few online evidence based online programs, unfortunately they are not free.   Developed by a sleep expert who created a drug-free program for insomnia proven more effective than sleeping pills.  www.cbtforinsomnia.com Sleepio is an evidence-based digital sleep improvement program   www.sleepio.com SHUTi is designed to actively help retrain your body and mind for great sleep through six engaging Cognitive Behavioral Therapy for Insomnia strategy and learning sessions  BloggerCourse.[email or web address]  You will receive a survey after today's visit either digitally by e-mail or paper by [employer]. Your experiences and feedback matter to [place].  Please respond so we know how we are doing as we provide care for you.  Call us with any questions/concerns/needs.  It is my goal to be available to you for your health concerns.  Thanks for choosing me to be a partner in your healthcare needs!  Harlin Rain, FNP-C Family Nurse Practitioner [facility] Phone: [phone]

## 2020-01-14 ENCOUNTER — Ambulatory Visit: Payer: BC Managed Care – PPO | Admitting: Dermatology

## 2020-01-14 DIAGNOSIS — T148XXA Other injury of unspecified body region, initial encounter: Secondary | ICD-10-CM | POA: Insufficient documentation

## 2020-01-14 DIAGNOSIS — R21 Rash and other nonspecific skin eruption: Secondary | ICD-10-CM | POA: Insufficient documentation

## 2020-01-14 LAB — CBC WITH DIFFERENTIAL/PLATELET
Absolute Monocytes: 363 cells/uL (ref 200–950)
Basophils Absolute: 29 cells/uL (ref 0–200)
Basophils Relative: 0.6 %
Eosinophils Absolute: 181 cells/uL (ref 15–500)
Eosinophils Relative: 3.7 %
HCT: 40.2 % (ref 35.0–45.0)
Hemoglobin: 13.3 g/dL (ref 11.7–15.5)
Lymphs Abs: 1181 cells/uL (ref 850–3900)
MCH: 30.2 pg (ref 27.0–33.0)
MCHC: 33.1 g/dL (ref 32.0–36.0)
MCV: 91.4 fL (ref 80.0–100.0)
MPV: 11.2 fL (ref 7.5–12.5)
Monocytes Relative: 7.4 %
Neutro Abs: 3146 cells/uL (ref 1500–7800)
Neutrophils Relative %: 64.2 %
Platelets: 265 10*3/uL (ref 140–400)
RBC: 4.4 10*6/uL (ref 3.80–5.10)
RDW: 13 % (ref 11.0–15.0)
Total Lymphocyte: 24.1 %
WBC: 4.9 10*3/uL (ref 3.8–10.8)

## 2020-01-14 LAB — COMPLETE METABOLIC PANEL WITH GFR
AG Ratio: 2 (calc) (ref 1.0–2.5)
ALT: 18 U/L (ref 6–29)
AST: 15 U/L (ref 10–35)
Albumin: 4.3 g/dL (ref 3.6–5.1)
Alkaline phosphatase (APISO): 81 U/L (ref 37–153)
BUN: 15 mg/dL (ref 7–25)
CO2: 30 mmol/L (ref 20–32)
Calcium: 9.7 mg/dL (ref 8.6–10.4)
Chloride: 105 mmol/L (ref 98–110)
Creat: 0.85 mg/dL (ref 0.50–1.05)
GFR, Est African American: 92 mL/min/{1.73_m2} (ref >=60)
GFR, Est Non African American: 79 mL/min/{1.73_m2} (ref >=60)
Globulin: 2.1 g/dL (calc) (ref 1.9–3.7)
Glucose, Bld: 90 mg/dL (ref 65–139)
Potassium: 4.5 mmol/L (ref 3.5–5.3)
Sodium: 140 mmol/L (ref 135–146)
Total Bilirubin: 0.4 mg/dL (ref 0.2–1.2)
Total Protein: 6.4 g/dL (ref 6.1–8.1)

## 2020-01-14 NOTE — Progress Notes (Signed)
Labs WNL - Keep appointment with dermatology for today.  Thanks

## 2020-01-14 NOTE — Assessment & Plan Note (Signed)
Likely rebound rash from the 5 day taper from Prednisone after exposure to poison oak.  Will treat with longer taper to break the histamine response cycle.  Will refer to dermatology for rash with elevation with exposure to cold water on body.    Plan: 1) Take prednisone taper as directed 2) Return to clinic if worsening of symptoms or if no relief with current treatment plan.

## 2020-01-14 NOTE — Assessment & Plan Note (Signed)
Blister noted on right foot between 2nd and 3rd toes.  Discussed has a callus over it now, can soak and lightly use a pumice stone to help with softening the skin and can coat with aquaphor and/or vaseline to help stay moisturized until resolves.

## 2020-01-14 NOTE — Assessment & Plan Note (Signed)
Acute grief response with anxiety due to loss of father.  Discussed self care during this time and provided education on mood and sleep hygiene.  Denied any needs for daily SSRI for a short course with acute grief response.  Let patient know if anything changed to contact our office and can discuss via the phone.

## 2020-03-07 ENCOUNTER — Ambulatory Visit: Payer: BC Managed Care – PPO | Admitting: Dermatology

## 2020-04-12 ENCOUNTER — Ambulatory Visit: Payer: BC Managed Care – PPO | Admitting: Family Medicine

## 2020-10-11 ENCOUNTER — Ambulatory Visit: Payer: BC Managed Care – PPO | Admitting: Cardiology

## 2020-10-11 ENCOUNTER — Encounter: Payer: Self-pay | Admitting: Cardiology

## 2020-10-11 ENCOUNTER — Other Ambulatory Visit: Payer: Self-pay

## 2020-10-11 VITALS — BP 120/86 | HR 74 | Ht 66.0 in | Wt 167.0 lb

## 2020-10-11 DIAGNOSIS — E7800 Pure hypercholesterolemia, unspecified: Secondary | ICD-10-CM | POA: Diagnosis not present

## 2020-10-11 DIAGNOSIS — I471 Supraventricular tachycardia: Secondary | ICD-10-CM | POA: Diagnosis not present

## 2020-10-11 NOTE — Patient Instructions (Signed)

## 2020-10-11 NOTE — Progress Notes (Signed)
Cardiology Office Note:    Date:  10/11/2020   ID:  Tanya Hill, DOB 07-18-1966, MRN [id number]  PCP:  Tarri Fuller, FNP  Cardiologist:  Debbe Odea, MD  Electrophysiologist:  None   Referring MD: Tarri Fuller, FNP   Chief Complaint  Patient presents with  . Follow-up    Annual follow up. Medications verbally reviewed with patient.     History of Present Illness:    Tanya Hill is a 54 y.o. female with a hx of hyperlipidemia, hypertension, paroxysmal SVT who presents for a follow-up.  Patient was originally seen due to palpitations.  Cardiac monitor did reveal paroxysmal SVT.  She was started on Toprol-XL, with marked improvement in symptoms.  Presents today for an annual follow-up, tolerating Toprol, no significant palpitations noted.  Prior notes Echo 08/2019 normal systolic and diastolic function, EF 60 to 65%. Cardiac monitor 07/2019, 4 paroxysmal SVTs longest lasting 18 beats. Coronary CTA 09/2019, calcium score 0, no evidence of CAD.   Past Medical History:  Diagnosis Date  . Endometriosis   . 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 Hyperlipidemia   . Hypertension 2017    Past Surgical History:  Procedure Laterality Date  . ABDOMINAL HYSTERECTOMY  2008  . CESAREAN SECTION  1990  . LAPAROSCOPIC SALPINGO OOPHERECTOMY Right 02/25/2017   Procedure: LAPAROSCOPIC RIGHT SALPINGO OOPHORECTOMY;  Surgeon: Linzie Collin, MD;  Location: [facility] ORS;  Service: Gynecology;  Laterality: Right;  . LAPAROTOMY N/A 02/25/2017   Procedure: LAPAROTOMY;  Surgeon: Linzie Collin, MD;  Location: [facility] ORS;  Service: Gynecology;  Laterality: N/A;  . OOPHORECTOMY Left     Current Medications: Current Meds  Medication Sig  . albuterol (VENTOLIN HFA) 108 (90 Base) MCG/ACT inhaler Inhale 2 puffs into the lungs every 6 (six) hours as needed for wheezing or shortness of breath.  . diphenhydrAMINE (BENADRYL) 25 MG tablet Take 25 mg by mouth every 4 (four) hours  as needed.  . fexofenadine (ALLEGRA) 180 MG tablet Take 180 mg by mouth daily.  . metoprolol succinate (TOPROL-XL) 25 MG 24 hr tablet Take 1 tablet (25 mg total) by mouth daily.  . Multiple Vitamins-Minerals (MULTIVITAMIN WITH MINERALS) tablet Take 1 tablet by mouth daily.     Allergies:   Latex and Penicillins   Social History   Socioeconomic History  . Marital status: Divorced    Spouse name: Not on file  . Number of children: Not on file  . Years of education: Not on file  . Highest education level: Not on file  Occupational History  . Not on file  Tobacco Use  . Smoking status: Never Smoker  . Smokeless tobacco: Never Used  Vaping Use  . Vaping Use: Never used  Substance and Sexual Activity  . Alcohol use: No  . Drug use: No  . Sexual activity: Not Currently    Birth control/protection: None  Other Topics Concern  . Not on file  Social History Narrative  . Not on file   Social Determinants of Health   Financial Resource Strain: Not on file  Food Insecurity: Not on file  Transportation Needs: Not on file  Physical Activity: Not on file  Stress: Not on file  Social Connections: Not on file     Family History: The patient's family history includes Breast cancer in her cousin, paternal aunt, and another family member; Cancer in her father; Healthy in her brother; Hyperlipidemia in her father; Hypertension in her mother; Stroke  in her mother.  ROS:   Please see the history of present illness.     All other systems reviewed and are negative.  EKGs/Labs/Other Studies Reviewed:    The following studies were reviewed today:  EKG:  EKG is  ordered today.  The ekg ordered today demonstrates normal sinus tachycardia, heart rate 54, otherwise normal ECG.  Recent Labs: 01/13/2020: ALT 18; BUN 15; Creat 0.85; Hemoglobin 13.3; Platelets 265; Potassium 4.5; Sodium 140  Recent Lipid Panel    Component Value Date/Time   CHOL 215 (H) 05/26/2019 1003   TRIG 86 05/26/2019  1003   HDL 47 (L) 05/26/2019 1003   CHOLHDL 4.6 05/26/2019 1003   LDLCALC 149 (H) 05/26/2019 1003    Physical Exam:    VS:  BP 120/86 (BP Location: Left Arm, Patient Position: Sitting, Cuff Size: Normal)   Pulse 74   Ht 5\' 6"  (1.676 m)   Wt 167 lb (75.8 kg)   SpO2 98%   BMI 26.95 kg/m     Wt Readings from Last 3 Encounters:  10/11/20 167 lb (75.8 kg)  01/13/20 153 lb 6.4 oz (69.6 kg)  01/03/20 160 lb (72.6 kg)     GEN:  Well nourished, well developed in no acute distress HEENT: Normal NECK: No JVD; No carotid bruits LYMPHATICS: No lymphadenopathy CARDIAC: RRR, no murmurs, rubs, gallops RESPIRATORY:  Clear to auscultation without rales, wheezing or rhonchi  ABDOMEN: Soft, non-tender, non-distended MUSCULOSKELETAL:  No edema; No deformity  SKIN: Warm and dry NEUROLOGIC:  Alert and oriented x 3 PSYCHIATRIC:  Normal affect   ASSESSMENT:    1. Paroxysmal SVT (supraventricular tachycardia) (HCC)   2. Pure hypercholesterolemia      PLAN:    In order of problems listed above:  1.  Paroxysmal SVTs, palpitations much improved on Toprol-XL. Continue Toprol-XL 25 mg daily.   2.  Hyperlipidemia, 10-year ASCVD risk 1.7%.  Patient not in statin benefit group.  Low-cholesterol diet recommended.  Follow-up in 1 year  This note was generated in part or whole with voice recognition software. Voice recognition is usually quite accurate but there are transcription errors that can and very often do occur. I apologize for any typographical errors that were not detected and corrected.  Medication Adjustments/Labs and Tests Ordered: Current medicines are reviewed at length with the patient today.  Concerns regarding medicines are outlined above.  Orders Placed This Encounter  Procedures  . EKG 12-Lead   No orders of the defined types were placed in this encounter.   Patient Instructions  Medication Instructions:  Your physician recommends that you continue on your current  medications as directed. Please refer to the Current Medication list given to you today.  *If you need a refill on your cardiac medications before your next appointment, please call your pharmacy*   Lab Work: None Ordered If you have labs (blood work) drawn today and your tests are completely normal, you will receive your results only by: 01/05/20 MyChart Message (if you have MyChart) OR . A paper copy in the mail If you have any lab test that is abnormal or we need to change your treatment, we will call you to review the results.   Testing/Procedures: None Ordered   Follow-Up: At [facility], you and your health needs are our priority.  As part of our continuing mission to provide you with exceptional heart care, we have created designated Provider Care Teams.  These Care Teams include your primary Cardiologist (physician) and Advanced Practice  Providers (APPs -  Physician Assistants and Nurse Practitioners) who all work together to provide you with the care you need, when you need it.  We recommend signing up for the patient portal called "MyChart".  Sign up information is provided on this After Visit Summary.  MyChart is used to connect with patients for Virtual Visits (Telemedicine).  Patients are able to view lab/test results, encounter notes, upcoming appointments, etc.  Non-urgent messages can be sent to your provider as well.   To learn more about what you can do with MyChart, go to [email or web address].    Your next appointment:   1 year(s)  The format for your next appointment:   In Person  Provider:   Debbe Odea, MD   Other Instructions      Signed, Debbe Odea, MD  10/11/2020 10:47 AM    [facility]

## 2020-10-27 ENCOUNTER — Telehealth: Payer: Self-pay | Admitting: Cardiology

## 2020-10-27 MED ORDER — METOPROLOL SUCCINATE ER 25 MG PO TB24: 25.0000 mg | ORAL_TABLET | Freq: Every day | ORAL | 3 refills | Status: AC

## 2020-10-27 NOTE — Telephone Encounter (Signed)
*  STAT* If patient is at the pharmacy, call can be transferred to refill team.   1. Which medications need to be refilled? (please list name of each medication and dose if known)    Metoprolol 25 mg po q d   2. Which pharmacy/location (including street and city if local pharmacy) is medication to be sent to?   [facility] [place]   3. Do they need a 30 day or 90 day supply? 90

## 2020-10-27 NOTE — Telephone Encounter (Signed)
Requested Prescriptions   Signed Prescriptions Disp Refills  . metoprolol succinate (TOPROL-XL) 25 MG 24 hr tablet 90 tablet 3    Sig: Take 1 tablet (25 mg total) by mouth daily.    Authorizing Provider: Debbe Odea    Ordering User: Kendrick Fries

## 2021-10-27 ENCOUNTER — Telehealth: Payer: Self-pay | Admitting: Cardiology

## 2021-10-27 NOTE — Telephone Encounter (Signed)
Patient returning call.  Per Coleen ok to refer to Clydie Braun at [facility] that is in patient new network.    Patient concerned she may run out of medication before new patient appt and was advised if she gets low before transition to call our office for a refill.   Patient appreciative with no further questions or concerns.   Referral faxed with last note, labs and test results  to :   Clydie Braun [facility] cardiology

## 2021-10-27 NOTE — Telephone Encounter (Signed)
Patient calling States that due to insurance she would have to change cardiologists  Would like to know if she can get a referral  Please call to discuss

## 2021-10-27 NOTE — Telephone Encounter (Signed)
Callled patient and left a VM requesting a call back.

## 2021-11-01 ENCOUNTER — Telehealth: Payer: Self-pay | Admitting: Cardiology

## 2021-11-01 NOTE — Telephone Encounter (Signed)
[facility] cardiology is calling regarding a referral that was sent by Dr. Myriam Forehand -Sandie Ano. They need an diagnosis,. Please call

## 2021-11-01 NOTE — Telephone Encounter (Signed)
Called Tori back at [facility] and informed her the patients diagnoses are Paroxysmal SVT and Pure hypercholesterolemia, and that she has had a change in insurance that is why she is changing providers.
